# Patient Record
Sex: Female | Born: 1995 | Race: White | Hispanic: No | Marital: Single | Smoking: Never smoker
Health system: Southern US, Community
[De-identification: ages and names within clinical notes are randomized; demographics above are authoritative.]

## PROBLEM LIST (undated history)

## (undated) DIAGNOSIS — E669 Obesity, unspecified: Secondary | ICD-10-CM

---
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
## 2015-07-18 ENCOUNTER — Ambulatory Visit (INDEPENDENT_AMBULATORY_CARE_PROVIDER_SITE_OTHER): Payer: 59 | Admitting: Internal Medicine

## 2015-07-18 ENCOUNTER — Ambulatory Visit (INDEPENDENT_AMBULATORY_CARE_PROVIDER_SITE_OTHER): Payer: 59

## 2015-07-18 VITALS — BP 126/80 | HR 68 | Temp 98.3°F | Resp 16 | Ht 66.0 in | Wt 219.0 lb

## 2015-07-18 DIAGNOSIS — M25571 Pain in right ankle and joints of right foot: Secondary | ICD-10-CM | POA: Diagnosis not present

## 2015-07-18 NOTE — Progress Notes (Signed)
   Subjective:  By signing my name below, I, Amanda [name], attest that this documentation has been prepared under the direction and in the presence of Amanda Lin, MD. Electronically Signed: Moises [name], [place]. 07/18/2015 , 10:58 AM .  Patient was seen in Room 12 .   Patient ID: Amanda [name], female    DOB: 06-05-1995, 20 y.o.   MRN: [id number] Chief Complaint  Patient presents with  . Ankle Injury    yesterday, right, fall   HPI Amanda [name] Risk is a 20 y.o. female who presents to [facility] complaining of right ankle pain due to fall injury that occurred yesterday. She was walking her dog and tripped over a net when walking down a small hill. She slipped her left leg forward and her right leg was pulled back in plantar flexion. She's been limping due to pain.   Patient Active Problem List   Diagnosis Date Noted  . Obesity, Class II, BMI 35-39.9 02/23/2013    Current outpatient prescriptions:  .  lisdexamfetamine (VYVANSE) 20 MG capsule, Take 20 mg by mouth daily., Disp: , Rfl:  No Known Allergies  Review of Systems  Constitutional: Negative for fever, chills and fatigue.  Musculoskeletal: Positive for myalgias, joint swelling, arthralgias and gait problem.  Skin: Negative for rash and wound.  Neurological: Negative for dizziness, weakness, numbness and headaches.       Objective:   Physical Exam  Constitutional: She is oriented to person, place, and time. She appears well-developed and well-nourished. No distress.  HENT:  Head: Normocephalic and atraumatic.  Eyes: EOM are normal. Pupils are equal, round, and reactive to light.  Neck: Neck supple.  Cardiovascular: Normal rate.   Pulmonary/Chest: Effort normal. No respiratory distress.  Musculoskeletal: Normal range of motion.  Right ankle: mild swelling tenderness to palpation over anterior deltoid and the ATF ligament with tenderness at the distal fibula with plantar flexion, achilles intact, ankle mortise intact and  the foot not tender or swollen  Neurological: She is alert and oriented to person, place, and time.  Skin: Skin is warm and dry.  Psychiatric: She has a normal mood and affect. Her behavior is normal.  Nursing note and vitals reviewed.   BP 126/80 mmHg  Pulse 68  Temp(Src) 98.3 F (36.8 C)  Resp 16  Ht 5\' 6"  (1.676 m)  Wt 219 lb (99.338 kg)  BMI 35.36 kg/m2  SpO2 98%  LMP 07/18/2015  Wt Readings from Last 3 Encounters:  07/18/15 219 lb (99.338 kg)  02/23/13 244 lb (110.678 kg) (99 %*, Z = 2.41)  Excellent results--- is continuing to work out frequently * Growth percentiles are based on CDC 2-20 Years data.     Dg Ankle Complete Right  07/18/2015  CLINICAL DATA:  Plantar flexion injury EXAM: RIGHT ANKLE - COMPLETE 3+ VIEW COMPARISON:  None. FINDINGS: Ankle mortise intact. The talar dome is normal. No malleolar fracture. The calcaneus is normal. IMPRESSION: No acute osseous abnormality. Electronically Signed   By: Suzy Bouchard M.D.   On: 07/18/2015 11:09      Assessment & Plan:  Pain in right ankle - Plan: DG Ankle Complete Right Sprain injury plantar flexion  Swede-O brace Advance weightbearing and exercise as tolerated

## 2015-07-18 NOTE — Patient Instructions (Signed)
     IF you received an x-ray today, you will receive an invoice from [facility]. Please contact [facility] at [phone] with questions or concerns regarding your invoice.   IF you received labwork today, you will receive an invoice from [facility]. Please contact [facility] at [phone] with questions or concerns regarding your invoice.   Our billing staff will not be able to assist you with questions regarding bills from these companies.  You will be contacted with the lab results as soon as they are available. The fastest way to get your results is to activate your My Chart account. Instructions are located on the last page of this paperwork. If you have not heard from us regarding the results in 2 weeks, please contact this office.      

## 2017-09-17 ENCOUNTER — Encounter (HOSPITAL_COMMUNITY): Payer: Self-pay | Admitting: Emergency Medicine

## 2017-09-17 ENCOUNTER — Emergency Department (HOSPITAL_COMMUNITY): Payer: No Typology Code available for payment source

## 2017-09-17 ENCOUNTER — Other Ambulatory Visit: Payer: Self-pay

## 2017-09-17 ENCOUNTER — Emergency Department (HOSPITAL_COMMUNITY)
Admission: EM | Admit: 2017-09-17 | Discharge: 2017-09-17 | Disposition: A | Discharge status: Home / Self Care | Payer: No Typology Code available for payment source | Attending: Physician Assistant | Admitting: Physician Assistant

## 2017-09-17 DIAGNOSIS — X501XXA Overexertion from prolonged static or awkward postures, initial encounter: Secondary | ICD-10-CM | POA: Insufficient documentation

## 2017-09-17 DIAGNOSIS — Z79899 Other long term (current) drug therapy: Secondary | ICD-10-CM | POA: Diagnosis not present

## 2017-09-17 DIAGNOSIS — Y939 Activity, unspecified: Secondary | ICD-10-CM | POA: Diagnosis not present

## 2017-09-17 DIAGNOSIS — Y999 Unspecified external cause status: Secondary | ICD-10-CM | POA: Insufficient documentation

## 2017-09-17 DIAGNOSIS — S99191A Other physeal fracture of right metatarsal, initial encounter for closed fracture: Secondary | ICD-10-CM

## 2017-09-17 DIAGNOSIS — S92354A Nondisplaced fracture of fifth metatarsal bone, right foot, initial encounter for closed fracture: Secondary | ICD-10-CM | POA: Diagnosis not present

## 2017-09-17 DIAGNOSIS — Y929 Unspecified place or not applicable: Secondary | ICD-10-CM | POA: Insufficient documentation

## 2017-09-17 MED ORDER — MELOXICAM 15 MG PO TABS: 15.0000 mg | ORAL_TABLET | Freq: Every day | ORAL | 0 refills | Status: DC

## 2017-09-17 NOTE — Progress Notes (Signed)
Orthopedic Tech Progress Note Patient Details:  Amanda [name] 09/26/1995 [id number]  Ortho Devices Type of Ortho Device: Ace wrap, Crutches, Short leg splint, Stirrup splint Ortho Device/Splint Interventions: Application   Post Interventions Patient Tolerated: Well, Ambulated well Instructions Provided: Care of device, Adjustment of device, Poper ambulation with device   [place] Pink 09/17/2017, 11:38 AM

## 2017-09-17 NOTE — Discharge Instructions (Signed)
You have a Jones fracture of the fifth metatarsal.  It is important that you remain nonweightbearing and follow up very closely with the orthopedic physician for further care and evaluation.  Contact a health care provider if: You have a fever. Your cast, splint, or boot is too loose or too tight. Your cast, splint, or boot is damaged. Your pain medicine is not helping. You have pain, tingling, or numbness in your foot that is not going away. Get help right away if: You have severe pain. You have tingling or numbness in your foot that is getting worse. Your foot feels cold or becomes numb. Your foot changes color.

## 2017-09-17 NOTE — ED Notes (Signed)
Ortho paged again

## 2017-09-17 NOTE — ED Notes (Signed)
Patient transported to X-ray 

## 2017-09-17 NOTE — ED Notes (Addendum)
Ortho tech paged- said will be 20 mins

## 2017-09-17 NOTE — ED Provider Notes (Signed)
[facility] EMERGENCY DEPARTMENT Provider Note   CSN: [id number] Arrival date & time: 09/17/17  3785     History   Chief Complaint Chief Complaint  Patient presents with  . Ankle Pain    HPI  Amanda [name] is a 22 y.o. female who complains of inversion injury to the right ankle 3 hours ago. There is pain and swelling at the lateral aspect of that ankle. The patient was not able to bear weight directly after the injury. No hx of previous injury. Denies numbness or tingling.  HPI  History reviewed. No pertinent past medical history.  Patient Active Problem List   Diagnosis Date Noted  . Obesity, Class II, BMI 35-39.9 02/23/2013    History reviewed. No pertinent surgical history.   OB History   None      Home Medications    Prior to Admission medications   Medication Sig Start Date End Date Taking? Authorizing Provider  lisdexamfetamine (VYVANSE) 20 MG capsule Take 20 mg by mouth daily.    [provider]    Family History Family History  Problem Relation Age of Onset  . Diabetes Mother     Social History Social History   Tobacco Use  . Smoking status: Never Smoker  . Smokeless tobacco: Never Used  Substance Use Topics  . Alcohol use: Not Currently  . Drug use: Never     Allergies   Patient has no known allergies.   Review of Systems Review of Systems  Musculoskeletal: Positive for gait problem and joint swelling.  Skin: Negative for wound.  Neurological: Negative for numbness.     Physical Exam Updated Vital Signs BP (!) 143/87 (BP Location: Right Arm)   Pulse 80   Temp 98.7 F (37.1 C) (Oral)   Resp 16   Ht 5\' 6"  (1.676 m)   Wt 96.6 kg (213 lb)   LMP 08/13/2017   SpO2 100%   BMI 34.38 kg/m   Physical Exam  Physical Exam  Nursing note and vitals reviewed. Constitutional: She is oriented to person, place, and time. She appears well-developed and well-nourished. No distress.  HENT:  Head:  Normocephalic and atraumatic.  Eyes: Conjunctivae normal and EOM are normal. Pupils are equal, round, and reactive to light. No scleral icterus.  Neck: Normal range of motion.  Cardiovascular: Normal rate, regular rhythm and normal heart sounds.  Exam reveals no gallop and no friction rub.   No murmur heard. Pulmonary/Chest: Effort normal and breath sounds normal. No respiratory distress.  Abdominal: Soft. Bowel sounds are normal. She exhibits no distension and no mass. There is no tenderness. There is no guarding.  Neurological: She is alert and oriented to person, place, and time.  Musculoskeletal: Patient appears well, vital signs are normal.  CV: RRR, No M/R/G, Peripheral pulses intact. No peripheral edema. Lungs: CTAB Abd: Soft, Non tender, non distended Musculoskeletal: R ankle exam: There is no swelling and tenderness over the lateral malleolus. No tenderness over the medial aspect of the ankle. There is tenderness over the base of the fifth metatarsal. The ankle joint is intact without excessive opening on stressing. X-Ray shows fracture of the base of the 5th metatarsal. The rest of the foot, ankle and leg exam is normal. Skin: Skin is warm and dry. She is not diaphoretic.    ED Treatments / Results  Labs (all labs ordered are listed, but only abnormal results are displayed) Labs Reviewed - No data to display  EKG None  Radiology No results found.  Procedures Procedures (including critical care time) SPLINT APPLICATION Date/Time: 82:64 AM Authorized by: Margarita Mail Consent: Verbal consent obtained. Risks and benefits: risks, benefits and alternatives were discussed Consent given by: patient Splint applied by: orthopedic technician Location details: Right foot Splint type: Posterior short leg Supplies used: Plaster Post-procedure: The splinted body part was neurovascularly unchanged following the procedure. Patient tolerance: Patient tolerated the procedure well  with no immediate complications.    Medications Ordered in ED Medications - No data to display   Initial Impression / Assessment and Plan / ED Course  I have reviewed the triage vital signs and the nursing notes.  Pertinent labs & imaging results that were available during my care of the patient were reviewed by me and considered in my medical decision making (see chart for details).     Patient with Jones fracture of the right foot.  Placed in posterior splint.  Told that she must remain nonweightbearing.  Given crutches.  Neurovascularly intact after splint placement.  She is to follow-up with orthopedics.  Discussed return precautions. Final Clinical Impressions(s) / ED Diagnoses   Final diagnoses:  Closed fracture of base of fifth metatarsal bone of right foot at metaphyseal-diaphyseal junction, initial encounter    ED Discharge Orders    None       Margarita Mail, PA-C 09/17/17 1708    Macarthur Critchley, MD 09/18/17 [phone]

## 2017-09-17 NOTE — ED Triage Notes (Signed)
Pt states she rolled her right ankle this morning and hear a "pop." Slight bruising to lateral side of foot. Pt able to ambulate, but states it is very painful.

## 2017-09-17 NOTE — ED Notes (Signed)
Ortho tech at bedside 

## 2019-02-04 ENCOUNTER — Other Ambulatory Visit: Payer: Self-pay

## 2019-02-04 DIAGNOSIS — Z20822 Contact with and (suspected) exposure to covid-19: Secondary | ICD-10-CM

## 2019-02-06 LAB — NOVEL CORONAVIRUS, NAA: SARS-CoV-2, NAA: NOT DETECTED

## 2021-08-25 ENCOUNTER — Encounter (HOSPITAL_BASED_OUTPATIENT_CLINIC_OR_DEPARTMENT_OTHER): Payer: Self-pay | Admitting: *Deleted

## 2021-08-25 ENCOUNTER — Emergency Department (HOSPITAL_BASED_OUTPATIENT_CLINIC_OR_DEPARTMENT_OTHER): Payer: No Typology Code available for payment source

## 2021-08-25 ENCOUNTER — Other Ambulatory Visit: Payer: Self-pay

## 2021-08-25 ENCOUNTER — Observation Stay (HOSPITAL_BASED_OUTPATIENT_CLINIC_OR_DEPARTMENT_OTHER)
Admission: EM | Admit: 2021-08-25 | Discharge: 2021-08-27 | Disposition: A | Discharge status: Home / Self Care | Payer: No Typology Code available for payment source | Attending: General Surgery | Admitting: General Surgery

## 2021-08-25 DIAGNOSIS — E669 Obesity, unspecified: Secondary | ICD-10-CM

## 2021-08-25 DIAGNOSIS — K8000 Calculus of gallbladder with acute cholecystitis without obstruction: Principal | ICD-10-CM | POA: Insufficient documentation

## 2021-08-25 DIAGNOSIS — R109 Unspecified abdominal pain: Secondary | ICD-10-CM | POA: Insufficient documentation

## 2021-08-25 DIAGNOSIS — K769 Liver disease, unspecified: Secondary | ICD-10-CM

## 2021-08-25 DIAGNOSIS — K838 Other specified diseases of biliary tract: Secondary | ICD-10-CM | POA: Diagnosis not present

## 2021-08-25 DIAGNOSIS — D1809 Hemangioma of other sites: Secondary | ICD-10-CM | POA: Insufficient documentation

## 2021-08-25 DIAGNOSIS — K760 Fatty (change of) liver, not elsewhere classified: Secondary | ICD-10-CM

## 2021-08-25 DIAGNOSIS — R1011 Right upper quadrant pain: Secondary | ICD-10-CM | POA: Diagnosis present

## 2021-08-25 DIAGNOSIS — K8020 Calculus of gallbladder without cholecystitis without obstruction: Secondary | ICD-10-CM

## 2021-08-25 HISTORY — DX: Obesity, unspecified: E66.9

## 2021-08-25 LAB — COMPREHENSIVE METABOLIC PANEL
ALT: 20 U/L (ref 0–44)
AST: 14 U/L — ABNORMAL LOW (ref 15–41)
Albumin: 4 g/dL (ref 3.5–5.0)
Alkaline Phosphatase: 84 U/L (ref 38–126)
Anion gap: 13 (ref 5–15)
BUN: 16 mg/dL (ref 6–20)
CO2: 25 mmol/L (ref 22–32)
Calcium: 9.7 mg/dL (ref 8.9–10.3)
Chloride: 101 mmol/L (ref 98–111)
Creatinine, Ser: 0.58 mg/dL (ref 0.44–1.00)
GFR, Estimated: >60 mL/min (ref >60)
Glucose, Bld: 89 mg/dL (ref 70–99)
Potassium: 4.4 mmol/L (ref 3.5–5.1)
Sodium: 139 mmol/L (ref 135–145)
Total Bilirubin: 0.3 mg/dL (ref 0.3–1.2)
Total Protein: 8.1 g/dL (ref 6.5–8.1)

## 2021-08-25 LAB — CBC
HCT: 35.3 % — ABNORMAL LOW (ref 36.0–46.0)
Hemoglobin: 11 g/dL — ABNORMAL LOW (ref 12.0–15.0)
MCH: 24.2 pg — ABNORMAL LOW (ref 26.0–34.0)
MCHC: 31.2 g/dL (ref 30.0–36.0)
MCV: 77.8 fL — ABNORMAL LOW (ref 80.0–100.0)
Platelets: 381 10*3/uL (ref 150–400)
RBC: 4.54 MIL/uL (ref 3.87–5.11)
RDW: 14.6 % (ref 11.5–15.5)
WBC: 15.7 10*3/uL — ABNORMAL HIGH (ref 4.0–10.5)
nRBC: 0 % (ref 0.0–0.2)

## 2021-08-25 LAB — URINALYSIS, ROUTINE W REFLEX MICROSCOPIC
Bilirubin Urine: NEGATIVE
Glucose, UA: NEGATIVE mg/dL
Ketones, ur: NEGATIVE mg/dL
Nitrite: NEGATIVE
Specific Gravity, Urine: 1.031 — ABNORMAL HIGH (ref 1.005–1.030)
pH: 5.5 (ref 5.0–8.0)

## 2021-08-25 LAB — LIPASE, BLOOD: Lipase: <10 U/L — ABNORMAL LOW (ref 11–51)

## 2021-08-25 LAB — PREGNANCY, URINE: Preg Test, Ur: NEGATIVE

## 2021-08-25 MED ORDER — ONDANSETRON HCL 4 MG/2ML IJ SOLN
4.0000 mg | Freq: Once | INTRAMUSCULAR | Status: AC
  Administered 2021-08-25: 4 mg via INTRAVENOUS
  Filled 2021-08-25: qty 2

## 2021-08-25 MED ORDER — FAMOTIDINE IN NACL 20-0.9 MG/50ML-% IV SOLN
20.0000 mg | Freq: Once | INTRAVENOUS | Status: AC
  Administered 2021-08-25: 20 mg via INTRAVENOUS
  Filled 2021-08-25: qty 50

## 2021-08-25 MED ORDER — PIPERACILLIN-TAZOBACTAM 3.375 G IVPB 30 MIN
3.3750 g | Freq: Once | INTRAVENOUS | Status: AC
Start: 2021-08-25 — End: 2021-08-25
  Administered 2021-08-25: 3.375 g via INTRAVENOUS
  Filled 2021-08-25: qty 50

## 2021-08-25 NOTE — ED Notes (Signed)
Called [facility] to transport patient to [facility] rm#[id number]

## 2021-08-25 NOTE — ED Triage Notes (Signed)
Pt is here for evaluation of abdominal pain which began as a mild discomfort on Sunday and has been getting much worse since yesterday making it difficult for her to sleep.  No n/v with this.  Pt denies any GU symptoms.  Pt reports chronic constipation and last BM was 2 days ago and stool was light and green in color.

## 2021-08-25 NOTE — ED Notes (Signed)
Pt tried providing a urine sample but was not able to go.

## 2021-08-25 NOTE — ED Provider Notes (Signed)
[facility] EMERGENCY DEPT Provider Note   CSN: [id number] Arrival date & time: 08/25/21  1613     History  Chief Complaint  Patient presents with   Abdominal Pain    Amanda [name] is a 26 y.o. female presenting to the ED with a chief complaint of right upper quadrant abdominal pain.  Symptoms began about 2 or 3 days ago.  States that she was concerned that it may be caused by constipation.  She took a laxative about 2 days ago and had a bowel movement but it was "not as much as I usually go."  She will sometimes have issues with constipation based on her diet.  She has tried ibuprofen with only some improvement in her pain.  Denies any nausea, vomiting, lower abdominal pain, urinary symptoms, possibility of pregnancy or history of gallstones that she is aware of.  No prior abdominal surgeries.  No chest pain or shortness of breath   Abdominal Pain Associated symptoms: constipation   Associated symptoms: no chest pain, no chills, no cough, no diarrhea, no dysuria, no fever, no hematuria, no nausea, no shortness of breath, no sore throat and no vomiting       Home Medications Prior to Admission medications   Medication Sig Start Date End Date Taking? Authorizing Provider  lisdexamfetamine (VYVANSE) 20 MG capsule Take 20 mg by mouth daily.    [provider]  meloxicam (MOBIC) 15 MG tablet Take 1 tablet (15 mg total) by mouth daily. Take 1 daily with food. 09/17/17   Margarita Mail, PA-C      Allergies    Patient has no known allergies.    Review of Systems   Review of Systems  Constitutional:  Negative for appetite change, chills and fever.  HENT:  Negative for ear pain, rhinorrhea, sneezing and sore throat.   Eyes:  Negative for photophobia and visual disturbance.  Respiratory:  Negative for cough, chest tightness, shortness of breath and wheezing.   Cardiovascular:  Negative for chest pain and palpitations.  Gastrointestinal:  Positive for abdominal  pain and constipation. Negative for blood in stool, diarrhea, nausea and vomiting.  Genitourinary:  Negative for dysuria, hematuria and urgency.  Musculoskeletal:  Negative for myalgias.  Skin:  Negative for rash.  Neurological:  Negative for dizziness, weakness and light-headedness.   Physical Exam Updated Vital Signs BP (!) 146/77   Pulse 90   Temp 98.4 F (36.9 C)   Resp 18   Wt 112 kg   LMP 08/04/2021   SpO2 97%   BMI 39.87 kg/m  Physical Exam Vitals and nursing note reviewed.  Constitutional:      General: She is not in acute distress.    Appearance: She is well-developed.  HENT:     Head: Normocephalic and atraumatic.     Nose: Nose normal.  Eyes:     General: No scleral icterus.       Left eye: No discharge.     Conjunctiva/sclera: Conjunctivae normal.  Cardiovascular:     Rate and Rhythm: Normal rate and regular rhythm.     Heart sounds: Normal heart sounds. No murmur heard.   No friction rub. No gallop.  Pulmonary:     Effort: Pulmonary effort is normal. No respiratory distress.     Breath sounds: Normal breath sounds.  Abdominal:     General: Bowel sounds are normal. There is no distension.     Palpations: Abdomen is soft.     Tenderness: There is abdominal  tenderness in the right upper quadrant. There is no guarding.  Musculoskeletal:        General: Normal range of motion.     Cervical back: Normal range of motion and neck supple.  Skin:    General: Skin is warm and dry.     Findings: No rash.  Neurological:     Mental Status: She is alert.     Motor: No abnormal muscle tone.     Coordination: Coordination normal.    ED Results / Procedures / Treatments   Labs (all labs ordered are listed, but only abnormal results are displayed) Labs Reviewed  LIPASE, BLOOD - Abnormal; Notable for the following components:      Result Value   Lipase <10 (*)    All other components within normal limits  COMPREHENSIVE METABOLIC PANEL - Abnormal; Notable for the  following components:   AST 14 (*)    All other components within normal limits  CBC - Abnormal; Notable for the following components:   WBC 15.7 (*)    Hemoglobin 11.0 (*)    HCT 35.3 (*)    MCV 77.8 (*)    MCH 24.2 (*)    All other components within normal limits  URINALYSIS, ROUTINE W REFLEX MICROSCOPIC - Abnormal; Notable for the following components:   APPearance HAZY (*)    Specific Gravity, Urine 1.031 (*)    Hgb urine dipstick MODERATE (*)    Protein, ur TRACE (*)    Leukocytes,Ua MODERATE (*)    Bacteria, UA MANY (*)    All other components within normal limits  PREGNANCY, URINE    EKG EKG Interpretation  Date/Time:  Thursday August 25 2021 17:35:40 EDT Ventricular Rate:  79 PR Interval:  144 QRS Duration: 80 QT Interval:  357 QTC Calculation: 410 R Axis:   37 Text Interpretation: Sinus rhythm No previous ECGs available Confirmed by Gareth Morgan [phone]) on 08/25/2021 7:02:56 PM  Radiology US Abdomen Limited RUQ (LIVER/GB)  Result Date: 08/25/2021 CLINICAL DATA:  Provided history: Abdominal pain for 5 days, worsening today. EXAM: ULTRASOUND ABDOMEN LIMITED RIGHT UPPER QUADRANT COMPARISON:  None Available. FINDINGS: Gallbladder: Cholecystolithiasis. Additional non-mobile shadowing foci within the gallbladder, which likely reflect adherent gallstones in a patient of this age. Mildly thickened appearance of the gallbladder wall (measuring 4-5 mm). However, the gallbladder is underdistended. No sonographic Murphy sign reported by the scanning technologist. Common bile duct: Diameter: 6-7 mm, mildly dilated. Liver: 3.5 x 2.9 x 4.9 cm hypoechoic mass with associated color Doppler flow in the left hepatic lobe. Background significantly increased hepatic parenchymal echogenicity.Portal vein is patent on color Doppler imaging with normal direction of blood flow towards the liver. IMPRESSION: Cholecystolithiasis. Additionally, there is a mildly thickened appearance of the gallbladder  wall (measuring 4-5 mm). However, the gallbladder is somewhat underdistended potentially accounting for this finding. Mildly dilated common bile duct (6-7 mm in diameter). 3.5 x 3.9 x 4.9 cm hypoechoic mass with associated color Doppler flow in the left hepatic lobe. Background prominent hepatic steatosis. Given the left hepatic lobe mass, apparent severe hepatic steatosis and mild common bile duct dilation, an MRI without and with contrast and MRCP are recommended for further evaluation. This may be non-emergent, if clinically appropriate. Electronically Signed   By: Kellie Simmering D.O.   On: 08/25/2021 18:45    Procedures Procedures    Medications Ordered in ED Medications  famotidine (PEPCID) IVPB 20 mg premix (0 mg Intravenous Stopped 08/25/21 1816)  ondansetron (ZOFRAN) injection 4  mg (4 mg Intravenous Given 08/25/21 1746)  piperacillin-tazobactam (ZOSYN) IVPB 3.375 g (0 g Intravenous Stopped 08/25/21 1948)    ED Course/ Medical Decision Making/ A&P                           Medical Decision Making Amount and/or Complexity of Data Reviewed Labs: ordered. Radiology: ordered.  Risk Prescription drug management. Decision regarding hospitalization.   27 year old female presenting to the ED with a chief complaint of right upper quadrant pain.  Symptoms have been going on for the past 2 to 3 days.  Reports associated constipation which is not unusual for her given changes to her diet.  Last bowel movement was 2 days ago.  Minimal improvement noted with ibuprofen taken yesterday.  Denies any vomiting, nausea, fever, chest pain.  On exam she has tenderness palpation of the right upper quadrant without rebound or guarding.  No lower tenderness.  Lungs are clear to auscultation bilaterally.  Vital signs within normal limits.  Will obtain lab work, right upper quadrant ultrasound to evaluate for cause of symptoms and attempted treat symptomatically.  Lab work significant for leukocytosis of 15.   Urinalysis with moderate leukocytes, many bacteria but patient asymptomatic.  CMP and lipase unremarkable.  EKG shows sinus rhythm, no ischemic changes, no STEMI.  Right upper quadrant ultrasound shows findings consistent with cholecystolithiasis.  There is thickened appearance of the gallbladder as well as dilated common bile duct.  There is also a hypoechoic mass on the left hepatic lobe.  I spoke to Dr. Redmond Pulling, general surgeon on-call.  He recommends MRI and they will evaluate the patient in the consult as well as admitting to hospitalist service at [facility].  I have started patient on antibiotics and her symptoms have been controlled here with medications.  Dr. Myna Hidalgo to admit to [facility].    Portions of this note were generated with [employer]. Dictation errors may occur despite best attempts at proofreading.         Final Clinical Impression(s) / ED Diagnoses Final diagnoses:  Liver lesion  Gallbladder colic    Rx / DC Orders ED Discharge Orders     None         Delia Heady, PA-C 08/25/21 Thresa Ross, MD 08/26/21 [phone]

## 2021-08-25 NOTE — H&P (Signed)
History and Physical    Patient: Amanda [name] FYB:[id number] DOB: 02/02/1996 DOA: 08/25/2021 DOS: the patient was seen and examined on 08/26/2021 PCP: Patient, No Pcp Per (Inactive)  Patient coming from: [place] ED  Chief Complaint:  Chief Complaint  Patient presents with   Abdominal Pain   HPI: Amanda [name] is a 26 y.o. female with medical history significant of obesity, prediabetes who presents with concerns of right upper quadrant pain.  Pain started about 2 days ago and was intermittent and crampy.  Worse with certain movements and with greasy food.  Initially thought she was constipated started to a high-fiber diet with improvement in her symptoms.  No nausea, vomiting or diarrhea.  No fevers or chills.  No dysuria, increasing urgency or frequency.  No previous abdominal surgery.  Only has occasional alcohol use.  In the ED, she was afebrile normotensive room air.  Had leukocytosis of 15.7.  CMP otherwise unremarkable with normal LFTs.  Lipase within normal limits.  Right upper quadrant ultrasound showed cholecystolithiasis and mildly thickened appearance of the gallbladder wall.  Mildly dilated common bile duct of 6 to 7 mm.  There is also a hypoechoic mass in the left hepatic lobe.  Background prominent hepatic steatosis. MRCP of the abdomen was recommended.  ED PA discussed with general surgeon Dr. Redmond Pulling on-call and they will follow in consultation in the morning.  She was started on IV Zosyn and transferred here to count.   Review of Systems: As mentioned in the history of present illness. All other systems reviewed and are negative. Past Medical History:  Diagnosis Date   Obesity    History reviewed. No pertinent surgical history. Social History:  reports that she has never smoked. She has never used smokeless tobacco. She reports that she does not currently use alcohol. She reports that she does not use drugs.  No Known Allergies  Family History   Problem Relation Age of Onset   Diabetes Mother     Prior to Admission medications   Medication Sig Start Date End Date Taking? Authorizing Provider  lisdexamfetamine (VYVANSE) 20 MG capsule Take 20 mg by mouth daily.    [provider]  meloxicam (MOBIC) 15 MG tablet Take 1 tablet (15 mg total) by mouth daily. Take 1 daily with food. 09/17/17   Margarita Mail, PA-C    Physical Exam: Vitals:   08/25/21 2030 08/25/21 2130 08/25/21 2235 08/25/21 2330  BP: 122/64 140/66 137/77 132/65  Pulse: 88 84 79 84  Resp: '16 13 15 17  '$ Temp:   98.1 F (36.7 C) 98.1 F (36.7 C)  TempSrc:   Oral Oral  SpO2: 98% 98% 95% 96%  Weight:       Constitutional: NAD, calm, comfortable, young obese female laying in bed Eyes: PERRL ENMT: Mucous membranes are moist.  Neck: normal, supple Respiratory: clear to auscultation bilaterally, no wheezing, no crackles. Normal respiratory effort.   Cardiovascular: Regular rate and rhythm, no murmurs / rubs / gallops.  Trace nonpitting edema bilateral lower extremity. Abdomen: Soft, nondistended, mild tenderness to lower border of right upper quadrant, no masses palpated. No hepatosplenomegaly. Bowel sounds positive.  Musculoskeletal: no clubbing / cyanosis. No joint deformity upper and lower extremities. Good ROM, no contractures. Normal muscle tone.  Skin: no rashes, lesions, ulcers.  Neurologic: CN 2-12 grossly intact.  Strength 5/5 in all 4.  Psychiatric: Normal judgment and insight. Alert and oriented x 3. Normal mood. Data Reviewed:  See HPI  Assessment  and Plan: * Cholecystolithiasis Mildly dilated common bile duct Seen on RUQ ultrasound with mildly thickened appearance of the gallbladder wall.  Questionable cholecystitis with leukocytosis.  Will continue on IV Zosyn and general surgery will see in consultation tomorrow. -MRCP of the abdomen pending  Hepatic steatosis Encourage weight loss.  Patient reports that she has been working towards  weight loss and has lost 3 pounds recently.  Hepatic lesion Mass seen in the left hepatic lobe on RUQ ultrasound.  MRCP is pending.  Obesity, Class II, BMI 35-39.9 Patient currently working towards weight loss and has lost 3 pounds recently.      Advance Care Planning:   Code Status: Full Code   Consults: General surgery  Family Communication: Discussed with mother at bedside  Severity of Illness: The appropriate patient status for this patient is OBSERVATION. Observation status is judged to be reasonable and necessary in order to provide the required intensity of service to ensure the patient's safety. The patient's presenting symptoms, physical exam findings, and initial radiographic and laboratory data in the context of their medical condition is felt to place them at decreased risk for further clinical deterioration. Furthermore, it is anticipated that the patient will be medically stable for discharge from the hospital within 2 midnights of admission.   Author: Orene Desanctis, DO 08/26/2021 12:45 AM  For on call review [email or web address].

## 2021-08-26 ENCOUNTER — Encounter (HOSPITAL_COMMUNITY): Payer: Self-pay | Admitting: Family Medicine

## 2021-08-26 ENCOUNTER — Observation Stay (HOSPITAL_COMMUNITY): Payer: No Typology Code available for payment source

## 2021-08-26 ENCOUNTER — Observation Stay (HOSPITAL_COMMUNITY): Payer: No Typology Code available for payment source | Admitting: Certified Registered Nurse Anesthetist

## 2021-08-26 ENCOUNTER — Other Ambulatory Visit: Payer: Self-pay

## 2021-08-26 ENCOUNTER — Encounter (HOSPITAL_COMMUNITY)
Admission: EM | Disposition: A | Discharge status: Home / Self Care | Payer: Self-pay | Source: Home / Self Care | Attending: Emergency Medicine

## 2021-08-26 ENCOUNTER — Observation Stay (HOSPITAL_BASED_OUTPATIENT_CLINIC_OR_DEPARTMENT_OTHER): Payer: No Typology Code available for payment source | Admitting: Certified Registered Nurse Anesthetist

## 2021-08-26 DIAGNOSIS — K8020 Calculus of gallbladder without cholecystitis without obstruction: Secondary | ICD-10-CM | POA: Diagnosis not present

## 2021-08-26 DIAGNOSIS — K769 Liver disease, unspecified: Secondary | ICD-10-CM

## 2021-08-26 DIAGNOSIS — K8000 Calculus of gallbladder with acute cholecystitis without obstruction: Secondary | ICD-10-CM | POA: Diagnosis not present

## 2021-08-26 DIAGNOSIS — K760 Fatty (change of) liver, not elsewhere classified: Secondary | ICD-10-CM

## 2021-08-26 HISTORY — PX: CHOLECYSTECTOMY: SHX55

## 2021-08-26 LAB — CBC
HCT: 34.1 % — ABNORMAL LOW (ref 36.0–46.0)
Hemoglobin: 10.7 g/dL — ABNORMAL LOW (ref 12.0–15.0)
MCH: 24.8 pg — ABNORMAL LOW (ref 26.0–34.0)
MCHC: 31.4 g/dL (ref 30.0–36.0)
MCV: 78.9 fL — ABNORMAL LOW (ref 80.0–100.0)
Platelets: 362 10*3/uL (ref 150–400)
RBC: 4.32 MIL/uL (ref 3.87–5.11)
RDW: 14.6 % (ref 11.5–15.5)
WBC: 13.2 10*3/uL — ABNORMAL HIGH (ref 4.0–10.5)
nRBC: 0 % (ref 0.0–0.2)

## 2021-08-26 LAB — COMPREHENSIVE METABOLIC PANEL
ALT: 27 U/L (ref 0–44)
AST: 22 U/L (ref 15–41)
Albumin: 3 g/dL — ABNORMAL LOW (ref 3.5–5.0)
Alkaline Phosphatase: 88 U/L (ref 38–126)
Anion gap: 9 (ref 5–15)
BUN: 14 mg/dL (ref 6–20)
CO2: 24 mmol/L (ref 22–32)
Calcium: 8.9 mg/dL (ref 8.9–10.3)
Chloride: 106 mmol/L (ref 98–111)
Creatinine, Ser: 0.7 mg/dL (ref 0.44–1.00)
GFR, Estimated: >60 mL/min (ref >60)
Glucose, Bld: 97 mg/dL (ref 70–99)
Potassium: 4.2 mmol/L (ref 3.5–5.1)
Sodium: 139 mmol/L (ref 135–145)
Total Bilirubin: 0.3 mg/dL (ref 0.3–1.2)
Total Protein: 7 g/dL (ref 6.5–8.1)

## 2021-08-26 LAB — HIV ANTIBODY (ROUTINE TESTING W REFLEX): HIV Screen 4th Generation wRfx: NONREACTIVE

## 2021-08-26 SURGERY — LAPAROSCOPIC CHOLECYSTECTOMY
Anesthesia: General | Site: Abdomen

## 2021-08-26 MED ORDER — LIDOCAINE 2% (20 MG/ML) 5 ML SYRINGE
INTRAMUSCULAR | Status: DC | PRN
  Administered 2021-08-26: 50 mg via INTRAVENOUS

## 2021-08-26 MED ORDER — ONDANSETRON HCL 4 MG/2ML IJ SOLN: 4.0000 mg | Freq: Once | INTRAMUSCULAR | Status: DC | PRN

## 2021-08-26 MED ORDER — HEMOSTATIC AGENTS (NO CHARGE) OPTIME
TOPICAL | Status: DC | PRN
  Administered 2021-08-26: 1 via TOPICAL

## 2021-08-26 MED ORDER — PIPERACILLIN-TAZOBACTAM 3.375 G IVPB
3.3750 g | Freq: Three times a day (TID) | INTRAVENOUS | Status: DC
  Administered 2021-08-26 – 2021-08-27 (×4): 3.375 g via INTRAVENOUS
  Filled 2021-08-26 (×6): qty 50

## 2021-08-26 MED ORDER — GADOBUTROL 1 MMOL/ML IV SOLN
9.0000 mL | Freq: Once | INTRAVENOUS | Status: AC | PRN
Start: 2021-08-26 — End: 2021-08-26
  Administered 2021-08-26: 9 mL via INTRAVENOUS

## 2021-08-26 MED ORDER — ONDANSETRON HCL 4 MG/2ML IJ SOLN
INTRAMUSCULAR | Status: DC | PRN
  Administered 2021-08-26: 4 mg via INTRAVENOUS

## 2021-08-26 MED ORDER — CHLORHEXIDINE GLUCONATE 0.12 % MT SOLN: 15.0000 mL | Freq: Once | OROMUCOSAL | Status: AC

## 2021-08-26 MED ORDER — FENTANYL CITRATE (PF) 100 MCG/2ML IJ SOLN
INTRAMUSCULAR | Status: AC
  Filled 2021-08-26: qty 2

## 2021-08-26 MED ORDER — DEXAMETHASONE SODIUM PHOSPHATE 10 MG/ML IJ SOLN
INTRAMUSCULAR | Status: DC | PRN
  Administered 2021-08-26: 10 mg via INTRAVENOUS

## 2021-08-26 MED ORDER — MIDAZOLAM HCL 2 MG/2ML IJ SOLN
INTRAMUSCULAR | Status: AC
  Filled 2021-08-26: qty 2

## 2021-08-26 MED ORDER — FENTANYL CITRATE (PF) 250 MCG/5ML IJ SOLN
INTRAMUSCULAR | Status: AC
  Filled 2021-08-26: qty 5

## 2021-08-26 MED ORDER — MORPHINE SULFATE (PF) 2 MG/ML IV SOLN: 2.0000 mg | INTRAVENOUS | Status: DC | PRN

## 2021-08-26 MED ORDER — MIDAZOLAM HCL 2 MG/2ML IJ SOLN
INTRAMUSCULAR | Status: DC | PRN
  Administered 2021-08-26: 2 mg via INTRAVENOUS

## 2021-08-26 MED ORDER — DEXAMETHASONE SODIUM PHOSPHATE 10 MG/ML IJ SOLN
INTRAMUSCULAR | Status: AC
  Filled 2021-08-26: qty 1

## 2021-08-26 MED ORDER — PROPOFOL 10 MG/ML IV BOLUS
INTRAVENOUS | Status: AC
  Filled 2021-08-26: qty 20

## 2021-08-26 MED ORDER — ACETAMINOPHEN 500 MG PO TABS
ORAL_TABLET | ORAL | Status: AC
  Filled 2021-08-26: qty 2

## 2021-08-26 MED ORDER — FENTANYL CITRATE (PF) 100 MCG/2ML IJ SOLN
25.0000 ug | INTRAMUSCULAR | Status: DC | PRN
  Administered 2021-08-26 (×2): 25 ug via INTRAVENOUS

## 2021-08-26 MED ORDER — FENTANYL CITRATE (PF) 250 MCG/5ML IJ SOLN
INTRAMUSCULAR | Status: DC | PRN
  Administered 2021-08-26 (×2): 100 ug via INTRAVENOUS
  Administered 2021-08-26: 50 ug via INTRAVENOUS

## 2021-08-26 MED ORDER — IBUPROFEN 600 MG PO TABS
600.0000 mg | ORAL_TABLET | Freq: Four times a day (QID) | ORAL | Status: DC | PRN
  Administered 2021-08-27: 600 mg via ORAL
  Filled 2021-08-26: qty 1

## 2021-08-26 MED ORDER — ONDANSETRON HCL 4 MG/2ML IJ SOLN
INTRAMUSCULAR | Status: AC
  Filled 2021-08-26: qty 2

## 2021-08-26 MED ORDER — LIDOCAINE 2% (20 MG/ML) 5 ML SYRINGE
INTRAMUSCULAR | Status: AC
  Filled 2021-08-26: qty 10

## 2021-08-26 MED ORDER — CHLORHEXIDINE GLUCONATE 0.12 % MT SOLN
OROMUCOSAL | Status: AC
  Administered 2021-08-26: 15 mL via OROMUCOSAL
  Filled 2021-08-26: qty 15

## 2021-08-26 MED ORDER — AMISULPRIDE (ANTIEMETIC) 5 MG/2ML IV SOLN: 10.0000 mg | Freq: Once | INTRAVENOUS | Status: DC | PRN

## 2021-08-26 MED ORDER — GLYCOPYRROLATE PF 0.2 MG/ML IJ SOSY
PREFILLED_SYRINGE | INTRAMUSCULAR | Status: AC
  Filled 2021-08-26: qty 1

## 2021-08-26 MED ORDER — ORAL CARE MOUTH RINSE: 15.0000 mL | Freq: Once | OROMUCOSAL | Status: AC

## 2021-08-26 MED ORDER — SUGAMMADEX SODIUM 200 MG/2ML IV SOLN
INTRAVENOUS | Status: DC | PRN
  Administered 2021-08-26: 200 mg via INTRAVENOUS

## 2021-08-26 MED ORDER — ROCURONIUM BROMIDE 10 MG/ML (PF) SYRINGE
PREFILLED_SYRINGE | INTRAVENOUS | Status: DC | PRN
  Administered 2021-08-26: 80 mg via INTRAVENOUS

## 2021-08-26 MED ORDER — OXYCODONE HCL 5 MG/5ML PO SOLN: 5.0000 mg | Freq: Once | ORAL | Status: DC | PRN

## 2021-08-26 MED ORDER — SODIUM CHLORIDE 0.9 % IR SOLN
Status: DC | PRN
  Administered 2021-08-26: 1000 mL

## 2021-08-26 MED ORDER — 0.9 % SODIUM CHLORIDE (POUR BTL) OPTIME
TOPICAL | Status: DC | PRN
  Administered 2021-08-26: 1000 mL

## 2021-08-26 MED ORDER — HYDROMORPHONE HCL 1 MG/ML IJ SOLN
INTRAMUSCULAR | Status: AC
  Filled 2021-08-26: qty 0.5

## 2021-08-26 MED ORDER — LACTATED RINGERS IV SOLN: INTRAVENOUS | Status: DC

## 2021-08-26 MED ORDER — OXYCODONE HCL 5 MG PO TABS: 5.0000 mg | ORAL_TABLET | Freq: Once | ORAL | Status: DC | PRN

## 2021-08-26 MED ORDER — PROPOFOL 10 MG/ML IV BOLUS
INTRAVENOUS | Status: DC | PRN
  Administered 2021-08-26: 200 mg via INTRAVENOUS

## 2021-08-26 MED ORDER — BUPIVACAINE HCL (PF) 0.25 % IJ SOLN
INTRAMUSCULAR | Status: DC | PRN
  Administered 2021-08-26: 30 mL

## 2021-08-26 MED ORDER — ROCURONIUM BROMIDE 10 MG/ML (PF) SYRINGE
PREFILLED_SYRINGE | INTRAVENOUS | Status: AC
  Filled 2021-08-26: qty 20

## 2021-08-26 MED ORDER — OXYCODONE HCL 5 MG PO TABS
5.0000 mg | ORAL_TABLET | Freq: Four times a day (QID) | ORAL | Status: DC | PRN
  Administered 2021-08-26 – 2021-08-27 (×2): 5 mg via ORAL
  Filled 2021-08-26 (×2): qty 1

## 2021-08-26 MED ORDER — ACETAMINOPHEN 500 MG PO TABS
1000.0000 mg | ORAL_TABLET | Freq: Once | ORAL | Status: AC
  Administered 2021-08-26: 1000 mg via ORAL

## 2021-08-26 MED ORDER — HYDROMORPHONE HCL 1 MG/ML IJ SOLN
INTRAMUSCULAR | Status: DC | PRN
  Administered 2021-08-26: .5 mg via INTRAVENOUS

## 2021-08-26 MED ORDER — SCOPOLAMINE 1 MG/3DAYS TD PT72
MEDICATED_PATCH | TRANSDERMAL | Status: AC
  Filled 2021-08-26: qty 1

## 2021-08-26 MED ORDER — SCOPOLAMINE 1 MG/3DAYS TD PT72
1.0000 | MEDICATED_PATCH | TRANSDERMAL | Status: DC
  Administered 2021-08-26: 1.5 mg via TRANSDERMAL

## 2021-08-26 MED ORDER — BUPIVACAINE HCL (PF) 0.25 % IJ SOLN
INTRAMUSCULAR | Status: AC
  Filled 2021-08-26: qty 30

## 2021-08-26 SURGICAL SUPPLY — 46 items
ADH SKN CLS APL DERMABOND .7 (GAUZE/BANDAGES/DRESSINGS) ×1
APL PRP STRL LF DISP 70% ISPRP (MISCELLANEOUS) ×1
APPLIER CLIP ROT 10 11.4 M/L (STAPLE) ×2
APR CLP MED LRG 11.4X10 (STAPLE) ×1
BAG COUNTER SPONGE SURGICOUNT (BAG) ×2 IMPLANT
BAG SPEC RTRVL 10 TROC 200 (ENDOMECHANICALS) ×1
BAG SPNG CNTER NS LX DISP (BAG) ×1
BLADE CLIPPER SURG (BLADE) IMPLANT
CANISTER SUCT 3000ML PPV (MISCELLANEOUS) ×2 IMPLANT
CHLORAPREP W/TINT 26 (MISCELLANEOUS) ×2 IMPLANT
CLIP APPLIE ROT 10 11.4 M/L (STAPLE) IMPLANT
CLIP LIGATING HEMO LOK XL GOLD (MISCELLANEOUS) IMPLANT
CLIP LIGATING HEMO O LOK GREEN (MISCELLANEOUS) ×2 IMPLANT
COVER SURGICAL LIGHT HANDLE (MISCELLANEOUS) ×2 IMPLANT
DERMABOND ADVANCED (GAUZE/BANDAGES/DRESSINGS) ×1
DERMABOND ADVANCED .7 DNX12 (GAUZE/BANDAGES/DRESSINGS) ×1 IMPLANT
ELECT REM PT RETURN 9FT ADLT (ELECTROSURGICAL) ×2
ELECTRODE REM PT RTRN 9FT ADLT (ELECTROSURGICAL) ×1 IMPLANT
ENDOLOOP SUT PDS II  0 18 (SUTURE) ×4
ENDOLOOP SUT PDS II 0 18 (SUTURE) IMPLANT
GLOVE BIOGEL PI IND STRL 7.0 (GLOVE) ×1 IMPLANT
GLOVE BIOGEL PI INDICATOR 7.0 (GLOVE) ×1
GLOVE SURG SS PI 7.0 STRL IVOR (GLOVE) ×2 IMPLANT
GOWN STRL REUS W/ TWL LRG LVL3 (GOWN DISPOSABLE) ×3 IMPLANT
GOWN STRL REUS W/TWL LRG LVL3 (GOWN DISPOSABLE) ×6
GRASPER SUT TROCAR 14GX15 (MISCELLANEOUS) ×2 IMPLANT
HEMOSTAT SNOW SURGICEL 2X4 (HEMOSTASIS) ×1 IMPLANT
KIT BASIN OR (CUSTOM PROCEDURE TRAY) ×2 IMPLANT
KIT TURNOVER KIT B (KITS) ×2 IMPLANT
NEEDLE 22X1 1/2 (OR ONLY) (NEEDLE) ×2 IMPLANT
NS IRRIG 1000ML POUR BTL (IV SOLUTION) ×2 IMPLANT
PAD ARMBOARD 7.5X6 YLW CONV (MISCELLANEOUS) ×2 IMPLANT
POUCH RETRIEVAL ECOSAC 10 (ENDOMECHANICALS) ×1 IMPLANT
POUCH RETRIEVAL ECOSAC 10MM (ENDOMECHANICALS) ×2
SCISSORS LAP 5X35 DISP (ENDOMECHANICALS) ×2 IMPLANT
SET IRRIG TUBING LAPAROSCOPIC (IRRIGATION / IRRIGATOR) ×2 IMPLANT
SET TUBE SMOKE EVAC HIGH FLOW (TUBING) ×2 IMPLANT
SLEEVE ENDOPATH XCEL 5M (ENDOMECHANICALS) ×4 IMPLANT
SPECIMEN JAR SMALL (MISCELLANEOUS) ×2 IMPLANT
SUT MNCRL AB 4-0 PS2 18 (SUTURE) ×2 IMPLANT
TOWEL GREEN STERILE (TOWEL DISPOSABLE) ×2 IMPLANT
TOWEL GREEN STERILE FF (TOWEL DISPOSABLE) ×2 IMPLANT
TRAY LAPAROSCOPIC MC (CUSTOM PROCEDURE TRAY) ×2 IMPLANT
TROCAR XCEL 12X100 BLDLESS (ENDOMECHANICALS) ×2 IMPLANT
TROCAR XCEL NON-BLD 5MMX100MML (ENDOMECHANICALS) ×2 IMPLANT
WATER STERILE IRR 1000ML POUR (IV SOLUTION) ×2 IMPLANT

## 2021-08-26 NOTE — Assessment & Plan Note (Addendum)
Mildly dilated common bile duct Seen on RUQ ultrasound with mildly thickened appearance of the gallbladder wall.  Questionable cholecystitis with leukocytosis.  Will continue on IV Zosyn and general surgery will see in consultation tomorrow. -MRCP of the abdomen pending

## 2021-08-26 NOTE — Transfer of Care (Signed)
Immediate Anesthesia Transfer of Care Note  Patient: Amanda [name]  Procedure(s) Performed: LAPAROSCOPIC CHOLECYSTECTOMY (Abdomen)  Patient Location: PACU  Anesthesia Type:General  Level of Consciousness: drowsy  Airway & Oxygen Therapy: Patient Spontanous Breathing  Post-op Assessment: Report given to RN and Post -op Vital signs reviewed and stable  Post vital signs: Reviewed and stable  Last Vitals:  Vitals Value Taken Time  BP 140/83 08/26/21 1258  Temp    Pulse 88 08/26/21 1304  Resp 20 08/26/21 1304  SpO2 96 % 08/26/21 1304  Vitals shown include unvalidated device data.  Last Pain:  Vitals:   08/26/21 1109  TempSrc:   PainSc: 2          Complications: No notable events documented.

## 2021-08-26 NOTE — Op Note (Addendum)
PATIENT:  Amanda [name]  26 y.o. female  PRE-OPERATIVE DIAGNOSIS:  Gallstones  POST-OPERATIVE DIAGNOSIS:  Gallstones  PROCEDURE:  Procedure(s): LAPAROSCOPIC CHOLECYSTECTOMY   SURGEON:  Evia Goldsmith, Arta Bruce, MD   ASSISTANT: none  ANESTHESIA:   local and general  Indications for procedure: Amanda [name] is a 26 y.o. female with symptoms of Abdominal pain and Nausea and vomiting consistent with gallbladder disease, Confirmed by ultrasound and MRI.  Description of procedure: The patient was brought into the operative suite, placed supine. Anesthesia was administered with endotracheal tube. Patient was strapped in place and foot board was secured. All pressure points were offloaded by foam padding. The patient was prepped and draped in the usual sterile fashion.  A periumbilical incision was made and optical entry was used to enter the abdomen. 2 5 mm trocars were placed on in the right lateral space on in the right subcostal space. A 41m trocar was placed in the subxiphoid space. Marcaine was infused to the subxiphoid space and lateral upper right abdomen in the transversus abdominis plane. Next the patient was placed in reverse trendelenberg. The gallbladder appeareddilated, acutely inflamed, and gangrenous. Due to the level of dilation, the gallbladder was evacuated with suction.  The gallbladder was retracted cephalad and lateral. The peritoneum was reflected off the infundibulum working lateral to medial. The cystic duct and cystic artery were identified and further dissection revealed a critical view. An endoloop was placed across the cystic duct. The cystic artery was doubly clipped and ligated.   The gallbladder was removed off the liver bed with cautery. The Gallbladder was placed in a specimen bag. The gallbladder fossa was irrigated and hemostasis was applied with cautery. One titanium clip was placed on the rind of the lateral gallbladder fossa. Amanda Morganwas placed in the gallbladder  fossa. The gallbladder was removed via the 183mtrocar. The fascial defect was closed with interrupted 0 vicryl suture via laparoscopic trans-fascial suture passer and loose stones were removed. Pneumoperitoneum was removed, all trocar were removed. All incisions were closed with 4-0 monocryl subcuticular stitch. The patient woke from anesthesia and was brought to PACU in stable condition. All counts were correct  Findings: gangrenous cholecystitis  Specimen: gallbladder  Blood loss: 100 ml  Local anesthesia: 30 ml Marcaine  Complications: none  PLAN OF CARE: Admit for overnight observation  PATIENT DISPOSITION:  PACU - hemodynamically stable.  [place], PAUtah

## 2021-08-26 NOTE — Consult Note (Signed)
Reason for Consult:gallstones Referring Provider: Hanny Elsberry is an 26 y.o. female.  HPI: 26 yo female with 1 day of abdominal pain. She initially thought it was constipation, but it persisted. It is epigastric. It does not radiate. She has nausea but no vomiting or diarrhea. She denies fevers or chills.  Past Medical History:  Diagnosis Date   Obesity     History reviewed. No pertinent surgical history.  Family History  Problem Relation Age of Onset   Diabetes Mother     Social History:  reports that she has never smoked. She has never used smokeless tobacco. She reports that she does not currently use alcohol. She reports that she does not use drugs.  Allergies: No Known Allergies  Medications: I have reviewed the patient's current medications.  Results for orders placed or performed during the hospital encounter of 08/25/21 (from the past 48 hour(s))  Lipase, blood     Status: Abnormal   Collection Time: 08/25/21  5:38 PM  Result Value Ref Range   Lipase <10 (L) 11 - 51 U/L    Comment: Performed at [facility], [address], [place], [place] [address]  Comprehensive metabolic panel     Status: Abnormal   Collection Time: 08/25/21  5:38 PM  Result Value Ref Range   Sodium 139 135 - 145 mmol/L   Potassium 4.4 3.5 - 5.1 mmol/L   Chloride 101 98 - 111 mmol/L   CO2 25 22 - 32 mmol/L   Glucose, Bld 89 70 - 99 mg/dL    Comment: Glucose reference range applies only to samples taken after fasting for at least 8 hours.   BUN 16 6 - 20 mg/dL   Creatinine, Ser 0.58 0.44 - 1.00 mg/dL   Calcium 9.7 8.9 - 10.3 mg/dL   Total Protein 8.1 6.5 - 8.1 g/dL   Albumin 4.0 3.5 - 5.0 g/dL   AST 14 (L) 15 - 41 U/L   ALT 20 0 - 44 U/L   Alkaline Phosphatase 84 38 - 126 U/L   Total Bilirubin 0.3 0.3 - 1.2 mg/dL   GFR, Estimated >60 >60 mL/min    Comment: (NOTE) Calculated using the CKD-EPI Creatinine Equation (2021)    Anion gap 13 5 - 15     Comment: Performed at [facility], [address] [place], [place] [address]  CBC     Status: Abnormal   Collection Time: 08/25/21  5:38 PM  Result Value Ref Range   WBC 15.7 (H) 4.0 - 10.5 K/uL   RBC 4.54 3.87 - 5.11 MIL/uL   Hemoglobin 11.0 (L) 12.0 - 15.0 g/dL   HCT 35.3 (L) 36.0 - 46.0 %   MCV 77.8 (L) 80.0 - 100.0 fL   MCH 24.2 (L) 26.0 - 34.0 pg   MCHC 31.2 30.0 - 36.0 g/dL   RDW 14.6 11.5 - 15.5 %   Platelets 381 150 - 400 K/uL   nRBC 0.0 0.0 - 0.2 %    Comment: Performed at [facility], [place], [place] [address]  Urinalysis, Routine w reflex microscopic Urine, Clean Catch     Status: Abnormal   Collection Time: 08/25/21  5:44 PM  Result Value Ref Range   Color, Urine YELLOW YELLOW   APPearance HAZY (A) CLEAR   Specific Gravity, Urine 1.031 (H) 1.005 - 1.030   pH 5.5 5.0 - 8.0   Glucose, UA NEGATIVE NEGATIVE mg/dL   Hgb urine  dipstick MODERATE (A) NEGATIVE   Bilirubin Urine NEGATIVE NEGATIVE   Ketones, ur NEGATIVE NEGATIVE mg/dL   Protein, ur TRACE (A) NEGATIVE mg/dL   Nitrite NEGATIVE NEGATIVE   Leukocytes,Ua MODERATE (A) NEGATIVE   RBC / HPF 0-5 0 - 5 RBC/hpf   WBC, UA 6-10 0 - 5 WBC/hpf   Bacteria, UA MANY (A) NONE SEEN   Squamous Epithelial / LPF 0-5 0 - 5   Mucus PRESENT     Comment: Performed at [facility], [address] [place], [place] [address]  Pregnancy, urine     Status: None   Collection Time: 08/25/21  5:44 PM  Result Value Ref Range   Preg Test, Ur NEGATIVE NEGATIVE    Comment:        THE SENSITIVITY OF THIS METHODOLOGY IS >20 mIU/mL. Performed at [facility], [address] [place], [place] [address]   Comprehensive metabolic panel     Status: Abnormal   Collection Time: 08/26/21  2:55 AM  Result Value Ref Range   Sodium 139 135 - 145 mmol/L   Potassium 4.2 3.5 - 5.1 mmol/L   Chloride 106 98 - 111 mmol/L   CO2 24 22 - 32 mmol/L   Glucose,  Bld 97 70 - 99 mg/dL    Comment: Glucose reference range applies only to samples taken after fasting for at least 8 hours.   BUN 14 6 - 20 mg/dL   Creatinine, Ser 0.70 0.44 - 1.00 mg/dL   Calcium 8.9 8.9 - 10.3 mg/dL   Total Protein 7.0 6.5 - 8.1 g/dL   Albumin 3.0 (L) 3.5 - 5.0 g/dL   AST 22 15 - 41 U/L   ALT 27 0 - 44 U/L   Alkaline Phosphatase 88 38 - 126 U/L   Total Bilirubin 0.3 0.3 - 1.2 mg/dL   GFR, Estimated >60 >60 mL/min    Comment: (NOTE) Calculated using the CKD-EPI Creatinine Equation (2021)    Anion gap 9 5 - 15    Comment: Performed at [facility] [address] [place], [place] [address]  CBC     Status: Abnormal   Collection Time: 08/26/21  2:55 AM  Result Value Ref Range   WBC 13.2 (H) 4.0 - 10.5 K/uL   RBC 4.32 3.87 - 5.11 MIL/uL   Hemoglobin 10.7 (L) 12.0 - 15.0 g/dL   HCT 34.1 (L) 36.0 - 46.0 %   MCV 78.9 (L) 80.0 - 100.0 fL   MCH 24.8 (L) 26.0 - 34.0 pg   MCHC 31.4 30.0 - 36.0 g/dL   RDW 14.6 11.5 - 15.5 %   Platelets 362 150 - 400 K/uL   nRBC 0.0 0.0 - 0.2 %    Comment: Performed at [facility], [place] [address], [place], [place] [address]    MR Abdomen W or Wo Contrast  Result Date: 08/26/2021 CLINICAL DATA:  26 year old female history of indeterminate liver lesion noted on prior abdominal ultrasound. Follow-up study. EXAM: MRI ABDOMEN WITHOUT AND WITH CONTRAST TECHNIQUE: Multiplanar multisequence MR imaging of the abdomen was performed both before and after the administration of intravenous contrast. CONTRAST:  47m GADAVIST GADOBUTROL 1 MMOL/ML IV SOLN COMPARISON:  No prior abdominal MRI. Abdominal ultrasound 08/25/2021. FINDINGS: Lower chest: Unremarkable. Hepatobiliary: Diffuse loss of signal intensity throughout the hepatic parenchyma on out of phase dual echo images, indicative of a background of hepatic steatosis. The lesion of concern in the left lobe of the liver is centered predominantly  in segment 2 (axial image 50 of series 902 and  coronal image 76 of series 10) estimated to measure approximately 4.3 x 3.1 x 3.0 cm. This lesion is very difficult to visualize on precontrast T1 and T2 weighted images, nearly isointense to the liver on all pulse sequences. On post gadolinium imaging the lesion initially appears predominantly hypovascular with some very heterogeneous areas of internal enhancement and enhancing nodularity, and progressively becomes more occult on delayed imaging similar to background blood pool. No other suspicious hepatic lesions are noted. No intra or extrahepatic biliary ductal dilatation. Common bile duct measures 2 mm in the porta hepatis. No filling defect within the common bile duct to suggest choledocholithiasis. There is some amorphous material lying dependently in the gallbladder which is mildly T2 hypointense, likely to reflect biliary sludge. Gallbladder appears moderately distended. Gallbladder wall appears thickened and edematous, with a trace amount of surrounding T2 signal intensity which suggests acute inflammation. Pancreas: No pancreatic mass. No pancreatic ductal dilatation. No pancreatic or peripancreatic fluid collections or inflammatory changes. Spleen:  Unremarkable. Adrenals/Urinary Tract: Bilateral kidneys and adrenal glands are normal in appearance. No hydroureteronephrosis in the visualized portions of the abdomen. Stomach/Bowel: Increased T2 signal intensity adjacent to the duodenum, likely related to acute gallbladder inflammation. Visualized portions are otherwise unremarkable. Vascular/Lymphatic: No aneurysm identified in the visualized abdominal vasculature. No lymphadenopathy noted in the abdomen. Other: Trace amount of T2 signal intensity in the right upper quadrant centered around the gallbladder and tracking caudally adjacent to the second portion of the duodenum. No large volume of ascites noted in the visualized portions of the peritoneal cavity. Musculoskeletal: No aggressive appearing  osseous lesions are noted in the visualized portions of the skeleton. IMPRESSION: 1. Biliary sludge lying dependently in the gallbladder with moderate distension of the gallbladder, thickening and edema in the gallbladder wall, and small amount of surrounding inflammation; imaging findings indicative of acute cholecystitis. 2. No choledocholithiasis or findings of biliary tract obstruction. 3. 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 This is favored to represent a benign finding, however, attention on repeat abdominal MRI with and without IV gadolinium (preferably Eovist) is recommended in 3-6 months to re-evaluate this lesion and ensure stability. 4. Hepatic steatosis. These results will be called to the ordering clinician or representative by the Radiologist Assistant, and communication documented in the PACS or [employer]. Electronically Signed   By: Vinnie Langton M.D.   On: 08/26/2021 05:43   US Abdomen Limited RUQ (LIVER/GB)  Result Date: 08/25/2021 CLINICAL DATA:  Provided history: Abdominal pain for 5 days, worsening today. EXAM: ULTRASOUND ABDOMEN LIMITED RIGHT UPPER QUADRANT COMPARISON:  None Available. FINDINGS: Gallbladder: Cholecystolithiasis. Additional non-mobile shadowing foci within the gallbladder, which likely reflect adherent gallstones in a patient of this age. Mildly thickened appearance of the gallbladder wall (measuring 4-5 mm). However, the gallbladder is underdistended. No sonographic Murphy sign reported by the scanning technologist. Common bile duct: Diameter: 6-7 mm, mildly dilated. Liver: 3.5 x 2.9 x 4.9 cm hypoechoic mass with associated color Doppler flow in the left hepatic lobe. Background significantly increased hepatic parenchymal echogenicity.Portal vein is patent on color Doppler imaging with normal direction of blood flow towards the liver. IMPRESSION:  Cholecystolithiasis. Additionally, there is a mildly thickened appearance of the gallbladder wall (measuring 4-5 mm). However, the gallbladder is somewhat underdistended potentially accounting for this finding. Mildly dilated common bile duct (6-7 mm in diameter). 3.5 x  3.9 x 4.9 cm hypoechoic mass with associated color Doppler flow in the left hepatic lobe. Background prominent hepatic steatosis. Given the left hepatic lobe mass, apparent severe hepatic steatosis and mild common bile duct dilation, an MRI without and with contrast and MRCP are recommended for further evaluation. This may be non-emergent, if clinically appropriate. Electronically Signed   By: Kellie Simmering D.O.   On: 08/25/2021 18:45    Review of Systems  Constitutional: Negative.   HENT: Negative.    Eyes: Negative.   Respiratory: Negative.    Cardiovascular: Negative.   Gastrointestinal:  Positive for abdominal pain.  Genitourinary: Negative.   Musculoskeletal: Negative.   Skin: Negative.   Neurological: Negative.   Endo/Heme/Allergies: Negative.   Psychiatric/Behavioral: Negative.     PE Blood pressure 128/65, pulse 76, temperature 98.3 F (36.8 C), temperature source Oral, resp. rate 18, weight 112 kg, last menstrual period 08/04/2021, SpO2 97 %. Constitutional: NAD; conversant; no deformities Eyes: Moist conjunctiva; no lid lag; anicteric; PERRL Neck: Trachea midline; no thyromegaly Lungs: Normal respiratory effort; no tactile fremitus CV: RRR; no palpable thrills; no pitting edema GI: Abd tender in epigastrium; no palpable hepatosplenomegaly MSK: Normal gait; no clubbing/cyanosis Psychiatric: Appropriate affect; alert and oriented x3 Lymphatic: No palpable cervical or axillary lymphadenopathy Skin: No major subcutaneous nodules. Warm and dry   Assessment/Plan: 26 yo female with gallstones and hemangioma on imaging. MRI negative for choledocholithiasis. -IV abx -plan for surgery today -We discussed the etiology  of her pain, we discussed treatment options and recommended surgery. We discussed details of surgery including general anesthesia, laparoscopic approach, identification of cystic duct and common bile duct. Ligation of cystic duct and cystic artery. Possible need for intraoperative cholangiogram or open procedure. Possible risks of common bile duct injury, liver injury, cystic duct leak, bleeding, infection, post-cholecystectomy syndrome. The patient showed good understanding and all questions were answered -possible same day discharge  I reviewed last 24 h vitals and pain scores, last 48 h intake and output, last 24 h labs and trends, and last 24 h imaging results.  This care required high  level of medical decision making.   Arta Bruce Emmah Bratcher 08/26/2021, 7:57 AM

## 2021-08-26 NOTE — Progress Notes (Signed)
Pt off the unit with transport to MRI °

## 2021-08-26 NOTE — Progress Notes (Signed)
Pt back from MRI 

## 2021-08-26 NOTE — Anesthesia Preprocedure Evaluation (Addendum)
Anesthesia Evaluation  Patient identified by MRN, date of birth, ID band Patient awake    Reviewed: Allergy & Precautions, NPO status , Patient's Chart, lab work & pertinent test results  Airway Mallampati: II  TM Distance: >3 FB Neck ROM: Full    Dental no notable dental hx.    Pulmonary neg pulmonary ROS,    Pulmonary exam normal breath sounds clear to auscultation       Cardiovascular negative cardio ROS Normal cardiovascular exam Rhythm:Regular Rate:Normal     Neuro/Psych negative neurological ROS  negative psych ROS   GI/Hepatic negative GI ROS, Neg liver ROS,   Endo/Other  Morbid obesity  Renal/GU negative Renal ROS  negative genitourinary   Musculoskeletal negative musculoskeletal ROS (+)   Abdominal   Peds negative pediatric ROS (+)  Hematology negative hematology ROS (+)   Anesthesia Other Findings   Reproductive/Obstetrics negative OB ROS                             Anesthesia Physical Anesthesia Plan  ASA: 2  Anesthesia Plan: General   Post-op Pain Management:    Induction: Intravenous  PONV Risk Score and Plan: 3  Airway Management Planned:   Additional Equipment:   Intra-op Plan:   Post-operative Plan: Extubation in OR  Informed Consent: I have reviewed the patients History and Physical, chart, labs and discussed the procedure including the risks, benefits and alternatives for the proposed anesthesia with the patient or authorized representative who has indicated his/her understanding and acceptance.     Dental advisory given  Plan Discussed with: CRNA, Anesthesiologist and Surgeon  Anesthesia Plan Comments:        Anesthesia Quick Evaluation

## 2021-08-26 NOTE — Anesthesia Procedure Notes (Signed)
Procedure Name: Intubation Date/Time: 08/26/2021 11:29 AM Performed by: Valda Favia, CRNA Pre-anesthesia Checklist: Patient identified, Emergency Drugs available, Suction available and Patient being monitored Patient Re-evaluated:Patient Re-evaluated prior to induction Oxygen Delivery Method: Circle System Utilized Preoxygenation: Pre-oxygenation with 100% oxygen Induction Type: IV induction Ventilation: Mask ventilation without difficulty and Oral airway inserted - appropriate to patient size Laryngoscope Size: Mac and 4 Grade View: Grade I Tube type: Oral Tube size: 7.0 mm Number of attempts: 1 Airway Equipment and Method: Stylet and Oral airway Placement Confirmation: ETT inserted through vocal cords under direct vision, positive ETCO2 and breath sounds checked- equal and bilateral Secured at: 22 cm Tube secured with: Tape Dental Injury: Teeth and Oropharynx as per pre-operative assessment

## 2021-08-26 NOTE — Assessment & Plan Note (Signed)
Patient currently working towards weight loss and has lost 3 pounds recently.

## 2021-08-26 NOTE — Assessment & Plan Note (Signed)
Mass seen in the left hepatic lobe on RUQ ultrasound.  MRCP is pending.

## 2021-08-26 NOTE — Assessment & Plan Note (Signed)
Encourage weight loss.  Patient reports that she has been working towards weight loss and has lost 3 pounds recently.

## 2021-08-26 NOTE — Progress Notes (Signed)
Pharmacy Antibiotic Note  Amanda [name] is a 26 y.o. female admitted on 08/25/2021 with  cholecystolithiasis .  Pharmacy has been consulted for Zosyn dosing. WBC is elevated. Renal function good.   Plan: Zosyn 3.375G IV q8h to be infused over 4 hours Trend WBC, temp, renal function  F/U infectious work-up  Weight: 112 kg (247 lb)  Temp (24hrs), Avg:97.9 F (36.6 C), Min:97.1 F (36.2 C), Max:98.4 F (36.9 C)  Recent Labs  Lab 08/25/21 1738  WBC 15.7*  CREATININE 0.58    CrCl cannot be calculated (Unknown ideal weight.).    No Known Allergies  Narda Bonds, PharmD, BCPS Clinical Pharmacist Phone: [phone]

## 2021-08-26 NOTE — Progress Notes (Signed)
PROGRESS NOTE        PATIENT DETAILS Name: Amanda [name] Age: 26 y.o. Sex: female Date of Birth: January 17, 1996 Admit Date: 08/25/2021 Admitting Physician Vianne Bulls, MD [id number], No Pcp Per (Inactive)  Brief Summary: Patient is a 26 y.o.  female with no past medical history-presenting with RUQ pain and leukocytosis-thought to have acute calculous cholecystitis.   Significant events: 6/1>> admit to [facility] for acute calculus cholecystitis.  Significant studies: 6/1>> RUQ ultrasound: Cholelithiasis-thickened gallbladder-mildly dilated CBD, 3.5 x 3.9 x 4.9 cm hypoechoic mass left hepatic lobe. 6/2>> MRI abdomen: Findings consistent with acute cholecystitis-left lobe liver lesion seen on ultrasound-has characteristics of cavernous hemangioma.  Significant microbiology data: None  Procedures: None  Consults: General surgery  Subjective: Slightly better but continues to have RUQ pain  Objective: Vitals: Blood pressure 140/81, pulse 75, temperature 97.9 F (36.6 C), temperature source Oral, resp. rate 17, height '5\' 6"'$  (1.676 m), weight 112 kg, last menstrual period 08/04/2021, SpO2 97 %.   Exam: Gen Exam:Alert awake-not in any distress HEENT:atraumatic, normocephalic Chest: B/L clear to auscultation anteriorly CVS:S1S2 regular Abdomen: RUQ pain continues. Extremities:no edema Neurology: Non focal Skin: no rash  Pertinent Labs/Radiology:    Latest Ref Rng & Units 08/26/2021    2:55 AM 08/25/2021    5:38 PM  CBC  WBC 4.0 - 10.5 K/uL 13.2   15.7    Hemoglobin 12.0 - 15.0 g/dL 10.7   11.0    Hematocrit 36.0 - 46.0 % 34.1   35.3    Platelets 150 - 400 K/uL 362   381      Lab Results  Component Value Date   NA 139 08/26/2021   K 4.2 08/26/2021   CL 106 08/26/2021   CO2 24 08/26/2021      Assessment/Plan: Acute calculus cholecystitis: Continue IV antibiotics-General surgery following-laparoscopic cholecystectomy planned for later  today.  Left lobe liver lesion-likely hemangioma: Repeat MRI in the outpatient setting.    Obesity: Estimated body mass index is 39.87 kg/m as calculated from the following:   Height as of this encounter: '5\' 6"'$  (1.676 m).   Weight as of this encounter: 112 kg.   Code status:   Code Status: Full Code   DVT Prophylaxis: SCDs Start: 08/26/21 0035   Family Communication: None at bedside   Disposition Plan: Status is: Observation The patient will require care spanning > 2 midnights and should be moved to inpatient because: Acute cholecystitis-for laparoscopic cholecystectomy later today.  Not stable for discharge.   Planned Discharge Destination:Home probably on 6/3 if cleared by general surgery.   Diet: Diet Order             Diet NPO time specified Except for: Sips with Meds, Ice Chips  Diet effective now                     Antimicrobial agents: Anti-infectives (From admission, onward)    Start     Dose/Rate Route Frequency Ordered Stop   08/26/21 0200  [MAR Hold]  piperacillin-tazobactam (ZOSYN) IVPB 3.375 g        (MAR Hold since Fri 08/26/2021 at 1051.Hold Reason: Transfer to a Procedural area)   3.375 g 12.5 mL/hr over 240 Minutes Intravenous Every 8 hours 08/26/21 0105     08/25/21 1915  piperacillin-tazobactam (ZOSYN) IVPB 3.375 g  3.375 g 100 mL/hr over 30 Minutes Intravenous  Once 08/25/21 1903 08/25/21 1948        MEDICATIONS: Scheduled Meds:  acetaminophen       scopolamine  1 patch Transdermal Q72H   scopolamine       Continuous Infusions:  lactated ringers 10 mL/hr at 08/26/21 1106   [MAR Hold] piperacillin-tazobactam (ZOSYN)  IV 3.375 g (08/26/21 0135)   PRN Meds:.[MAR Hold] ibuprofen   I have personally reviewed following labs and imaging studies  LABORATORY DATA: CBC: Recent Labs  Lab 08/25/21 1738 08/26/21 0255  WBC 15.7* 13.2*  HGB 11.0* 10.7*  HCT 35.3* 34.1*  MCV 77.8* 78.9*  PLT 381 371    Basic Metabolic  Panel: Recent Labs  Lab 08/25/21 1738 08/26/21 0255  NA 139 139  K 4.4 4.2  CL 101 106  CO2 25 24  GLUCOSE 89 97  BUN 16 14  CREATININE 0.58 0.70  CALCIUM 9.7 8.9    GFR: Estimated Creatinine Clearance: 134.1 mL/min (by C-G formula based on SCr of 0.7 mg/dL).  Liver Function Tests: Recent Labs  Lab 08/25/21 1738 08/26/21 0255  AST 14* 22  ALT 20 27  ALKPHOS 84 88  BILITOT 0.3 0.3  PROT 8.1 7.0  ALBUMIN 4.0 3.0*   Recent Labs  Lab 08/25/21 1738  LIPASE <10*   No results for input(s): AMMONIA in the last 168 hours.  Coagulation Profile: No results for input(s): INR, PROTIME in the last 168 hours.  Cardiac Enzymes: No results for input(s): CKTOTAL, CKMB, CKMBINDEX, TROPONINI in the last 168 hours.  BNP (last 3 results) No results for input(s): PROBNP in the last 8760 hours.  Lipid Profile: No results for input(s): CHOL, HDL, LDLCALC, TRIG, CHOLHDL, LDLDIRECT in the last 72 hours.  Thyroid Function Tests: No results for input(s): TSH, T4TOTAL, FREET4, T3FREE, THYROIDAB in the last 72 hours.  Anemia Panel: No results for input(s): VITAMINB12, FOLATE, FERRITIN, TIBC, IRON, RETICCTPCT in the last 72 hours.  Urine analysis:    Component Value Date/Time   COLORURINE YELLOW 08/25/2021 1744   APPEARANCEUR HAZY (A) 08/25/2021 1744   LABSPEC 1.031 (H) 08/25/2021 1744   PHURINE 5.5 08/25/2021 1744   GLUCOSEU NEGATIVE 08/25/2021 1744   HGBUR MODERATE (A) 08/25/2021 1744   BILIRUBINUR NEGATIVE 08/25/2021 1744   KETONESUR NEGATIVE 08/25/2021 1744   PROTEINUR TRACE (A) 08/25/2021 1744   NITRITE NEGATIVE 08/25/2021 1744   LEUKOCYTESUR MODERATE (A) 08/25/2021 1744    Sepsis Labs: Lactic Acid, Venous No results found for: LATICACIDVEN  MICROBIOLOGY: No results found for this or any previous visit (from the past 240 hour(s)).  RADIOLOGY STUDIES/RESULTS: MR Abdomen W or Wo Contrast  Result Date: 08/26/2021 CLINICAL DATA:  26 year old female history of  indeterminate liver lesion noted on prior abdominal ultrasound. Follow-up study. EXAM: MRI ABDOMEN WITHOUT AND WITH CONTRAST TECHNIQUE: Multiplanar multisequence MR imaging of the abdomen was performed both before and after the administration of intravenous contrast. CONTRAST:  26m GADAVIST GADOBUTROL 1 MMOL/ML IV SOLN COMPARISON:  No prior abdominal MRI. Abdominal ultrasound 08/25/2021. FINDINGS: Lower chest: Unremarkable. Hepatobiliary: Diffuse loss of signal intensity throughout the hepatic parenchyma on out of phase dual echo images, indicative of a background of hepatic steatosis. The lesion of concern in the left lobe of the liver is centered predominantly in segment 2 (axial image 50 of series 902 and coronal image 76 of series 10) estimated to measure approximately 4.3 x 3.1 x 3.0 cm. This lesion is very difficult to visualize  on precontrast T1 and T2 weighted images, nearly isointense to the liver on all pulse sequences. On post gadolinium imaging the lesion initially appears predominantly hypovascular with some very heterogeneous areas of internal enhancement and enhancing nodularity, and progressively becomes more occult on delayed imaging similar to background blood pool. No other suspicious hepatic lesions are noted. No intra or extrahepatic biliary ductal dilatation. Common bile duct measures 2 mm in the porta hepatis. No filling defect within the common bile duct to suggest choledocholithiasis. There is some amorphous material lying dependently in the gallbladder which is mildly T2 hypointense, likely to reflect biliary sludge. Gallbladder appears moderately distended. Gallbladder wall appears thickened and edematous, with a trace amount of surrounding T2 signal intensity which suggests acute inflammation. Pancreas: No pancreatic mass. No pancreatic ductal dilatation. No pancreatic or peripancreatic fluid collections or inflammatory changes. Spleen:  Unremarkable. Adrenals/Urinary Tract: Bilateral  kidneys and adrenal glands are normal in appearance. No hydroureteronephrosis in the visualized portions of the abdomen. Stomach/Bowel: Increased T2 signal intensity adjacent to the duodenum, likely related to acute gallbladder inflammation. Visualized portions are otherwise unremarkable. Vascular/Lymphatic: No aneurysm identified in the visualized abdominal vasculature. No lymphadenopathy noted in the abdomen. Other: Trace amount of T2 signal intensity in the right upper quadrant centered around the gallbladder and tracking caudally adjacent to the second portion of the duodenum. No large volume of ascites noted in the visualized portions of the peritoneal cavity. Musculoskeletal: No aggressive appearing osseous lesions are noted in the visualized portions of the skeleton. IMPRESSION: 1. Biliary sludge lying dependently in the gallbladder with moderate distension of the gallbladder, thickening and edema in the gallbladder wall, and small amount of surrounding inflammation; imaging findings indicative of acute cholecystitis. 2. No choledocholithiasis or findings of biliary tract obstruction. 3. 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 This is favored to represent a benign finding, however, attention on repeat abdominal MRI with and without IV gadolinium (preferably Eovist) is recommended in 3-6 months to re-evaluate this lesion and ensure stability. 4. Hepatic steatosis. These results will be called to the ordering clinician or representative by the Radiologist Assistant, and communication documented in the PACS or [employer]. Electronically Signed   By: Vinnie Langton M.D.   On: 08/26/2021 05:43   US Abdomen Limited RUQ (LIVER/GB)  Result Date: 08/25/2021 CLINICAL DATA:  Provided history: Abdominal pain for 5 days, worsening today. EXAM: ULTRASOUND ABDOMEN LIMITED RIGHT UPPER QUADRANT COMPARISON:  None  Available. FINDINGS: Gallbladder: Cholecystolithiasis. Additional non-mobile shadowing foci within the gallbladder, which likely reflect adherent gallstones in a patient of this age. Mildly thickened appearance of the gallbladder wall (measuring 4-5 mm). However, the gallbladder is underdistended. No sonographic Murphy sign reported by the scanning technologist. Common bile duct: Diameter: 6-7 mm, mildly dilated. Liver: 3.5 x 2.9 x 4.9 cm hypoechoic mass with associated color Doppler flow in the left hepatic lobe. Background significantly increased hepatic parenchymal echogenicity.Portal vein is patent on color Doppler imaging with normal direction of blood flow towards the liver. IMPRESSION: Cholecystolithiasis. Additionally, there is a mildly thickened appearance of the gallbladder wall (measuring 4-5 mm). However, the gallbladder is somewhat underdistended potentially accounting for this finding. Mildly dilated common bile duct (6-7 mm in diameter). 3.5 x 3.9 x 4.9 cm hypoechoic mass with associated color Doppler flow in the left hepatic lobe. Background prominent hepatic steatosis. Given the left hepatic lobe mass, apparent severe hepatic steatosis and mild common bile  duct dilation, an MRI without and with contrast and MRCP are recommended for further evaluation. This may be non-emergent, if clinically appropriate. Electronically Signed   By: Kellie Simmering D.O.   On: 08/25/2021 18:45     LOS: 0 days   Oren Binet, MD  [facility]    To contact the attending provider between 7A-7P or the covering provider during after hours 7P-7A, please log into the web site [email or web address] and access using universal [place] password for that web site. If you do not have the password, please call the hospital operator.  08/26/2021, 11:34 AM

## 2021-08-26 NOTE — Plan of Care (Signed)

## 2021-08-26 NOTE — Discharge Instructions (Signed)
[facility] LAPAROSCOPIC SURGERY: POST OP INSTRUCTIONS Always review your discharge instruction sheet given to you by the facility where your surgery was performed. IF YOU HAVE DISABILITY OR FAMILY LEAVE FORMS, YOU MUST BRING THEM TO THE OFFICE FOR PROCESSING.   DO NOT GIVE THEM TO YOUR DOCTOR.  PAIN CONTROL  First take acetaminophen (Tylenol) AND/or ibuprofen (Advil) to control your pain after surgery.  Follow directions on package.  Taking acetaminophen (Tylenol) and/or ibuprofen (Advil) regularly after surgery will help to control your pain and lower the amount of prescription pain medication you may need.  You should not take more than 3,000 mg (3 grams) of acetaminophen (Tylenol) in 24 hours.  You should not take ibuprofen (Advil), aleve, motrin, naprosyn or other NSAIDS if you have a history of stomach ulcers or chronic kidney disease.  A prescription for pain medication may be given to you upon discharge.  Take your pain medication as prescribed, if you still have uncontrolled pain after taking acetaminophen (Tylenol) or ibuprofen (Advil). Use ice packs to help control pain. If you need a refill on your pain medication, please contact your pharmacy.  They will contact our office to request authorization. Prescriptions will not be filled after 5pm or on week-ends.  HOME MEDICATIONS Take your usually prescribed medications unless otherwise directed.  DIET You should follow a light diet the first few days after arrival home.  Be sure to include lots of fluids daily. Avoid fatty, fried foods.   CONSTIPATION It is common to experience some constipation after surgery and if you are taking pain medication.  Increasing fluid intake and taking a stool softener (such as Colace) will usually help or prevent this problem from occurring.  A mild laxative (Milk of Magnesia or Miralax) should be taken according to package instructions if there are no bowel movements after 48  hours.  WOUND/INCISION CARE Most patients will experience some swelling and bruising in the area of the incisions.  Ice packs will help.  Swelling and bruising can take several days to resolve.  Unless discharge instructions indicate otherwise, follow guidelines below  STERI-STRIPS - you may remove your outer bandages 48 hours after surgery, and you may shower at that time.  You have steri-strips (small skin tapes) in place directly over the incision.  These strips should be left on the skin for 7-10 days.   DERMABOND/SKIN GLUE - you may shower in 24 hours.  The glue will flake off over the next 2-3 weeks. Any sutures or staples will be removed at the office during your follow-up visit.  ACTIVITIES You may resume regular (light) daily activities beginning the next day--such as daily self-care, walking, climbing stairs--gradually increasing activities as tolerated.  You may have sexual intercourse when it is comfortable.  Refrain from any heavy lifting or straining until approved by your doctor. You may drive when you are no longer taking prescription pain medication, you can comfortably wear a seatbelt, and you can safely maneuver your car and apply brakes.  FOLLOW-UP You should see your doctor in the office for a follow-up appointment approximately 2-3 weeks after your surgery.  You should have been given your post-op/follow-up appointment when your surgery was scheduled.  If you did not receive a post-op/follow-up appointment, make sure that you call for this appointment within a day or two after you arrive home to insure a convenient appointment time.   WHEN TO CALL YOUR DOCTOR: Fever over 101.0 Inability to urinate Continued bleeding from incision.   Increased pain, redness, or drainage from the incision. Increasing abdominal pain  The clinic staff is available to answer your questions during regular business hours.  Please don't hesitate to call and ask to speak to one of the nurses for  clinical concerns.  If you have a medical emergency, go to the nearest emergency room or call 911.  A surgeon from [facility] is always on call at the hospital. [address], [address], [place], [place]  [address] ? [address] [address], [place], [place]   [address] [phone] ? [phone] ? FAX [phone] Web site: [email or web address]      Managing Your Pain After Surgery Without Opioids    Thank you for participating in our program to help patients manage their pain after surgery without opioids. This is part of our effort to provide you with the best care possible, without exposing you or your family to the risk that opioids pose.  What pain can I expect after surgery? You can expect to have some pain after surgery. This is normal. The pain is typically worse the day after surgery, and quickly begins to get better. Many studies have found that many patients are able to manage their pain after surgery with Over-the-Counter (OTC) medications such as Tylenol and Motrin. If you have a condition that does not allow you to take Tylenol or Motrin, notify your surgical team.  How will I manage my pain? The best strategy for controlling your pain after surgery is around the clock pain control with Tylenol (acetaminophen) and Motrin (ibuprofen or Advil). Alternating these medications with each other allows you to maximize your pain control. In addition to Tylenol and Motrin, you can use heating pads or ice packs on your incisions to help reduce your pain.  How will I alternate your regular strength over-the-counter pain medication? You will take a dose of pain medication every three hours. Start by taking 650 mg of Tylenol (2 pills of 325 mg) 3 hours later take 600 mg of Motrin (3 pills of 200 mg) 3 hours after taking the Motrin take 650 mg of Tylenol 3 hours after that take 600 mg of Motrin.   - 1 -  See example - if your first dose of Tylenol is at 12:00  PM   12:00 PM Tylenol 650 mg (2 pills of 325 mg)  3:00 PM Motrin 600 mg (3 pills of 200 mg)  6:00 PM Tylenol 650 mg (2 pills of 325 mg)  9:00 PM Motrin 600 mg (3 pills of 200 mg)  Continue alternating every 3 hours   We recommend that you follow this schedule around-the-clock for at least 3 days after surgery, or until you feel that it is no longer needed. Use the table on the last page of this handout to keep track of the medications you are taking. Important: Do not take more than 3000mg of Tylenol or 3200mg of Motrin in a 24-hour period. Do not take ibuprofen/Motrin if you have a history of bleeding stomach ulcers, severe kidney disease, &/or actively taking a blood thinner  What if I still have pain? If you have pain that is not controlled with the over-the-counter pain medications (Tylenol and Motrin or Advil) you might have what we call "breakthrough" pain. You will receive a prescription for a small amount of an opioid pain medication such as Oxycodone, Tramadol, or Tylenol with Codeine. Use these opioid pills in the first 24 hours after surgery if you have breakthrough pain. Do   not take more than 1 pill every 4-6 hours.  If you still have uncontrolled pain after using all opioid pills, don't hesitate to call our staff using the number provided. We will help make sure you are managing your pain in the best way possible, and if necessary, we can provide a prescription for additional pain medication.   Day 1    Time  Name of Medication Number of pills taken  Amount of Acetaminophen  Pain Level   Comments  AM PM       AM PM       AM PM       AM PM       AM PM       AM PM       AM PM       AM PM       Total Daily amount of Acetaminophen Do not take more than  3,000 mg per day      Day 2    Time  Name of Medication Number of pills taken  Amount of Acetaminophen  Pain Level   Comments  AM PM       AM PM       AM PM       AM PM       AM PM       AM PM       AM  PM       AM PM       Total Daily amount of Acetaminophen Do not take more than  3,000 mg per day      Day 3    Time  Name of Medication Number of pills taken  Amount of Acetaminophen  Pain Level   Comments  AM PM       AM PM       AM PM       AM PM          AM PM       AM PM       AM PM       AM PM       Total Daily amount of Acetaminophen Do not take more than  3,000 mg per day      Day 4    Time  Name of Medication Number of pills taken  Amount of Acetaminophen  Pain Level   Comments  AM PM       AM PM       AM PM       AM PM       AM PM       AM PM       AM PM       AM PM       Total Daily amount of Acetaminophen Do not take more than  3,000 mg per day      Day 5    Time  Name of Medication Number of pills taken  Amount of Acetaminophen  Pain Level   Comments  AM PM       AM PM       AM PM       AM PM       AM PM       AM PM       AM PM       AM PM       Total Daily amount of Acetaminophen Do not take more than    3,000 mg per day       Day 6    Time  Name of Medication Number of pills taken  Amount of Acetaminophen  Pain Level  Comments  AM PM       AM PM       AM PM       AM PM       AM PM       AM PM       AM PM       AM PM       Total Daily amount of Acetaminophen Do not take more than  3,000 mg per day      Day 7    Time  Name of Medication Number of pills taken  Amount of Acetaminophen  Pain Level   Comments  AM PM       AM PM       AM PM       AM PM       AM PM       AM PM       AM PM       AM PM       Total Daily amount of Acetaminophen Do not take more than  3,000 mg per day        For additional information about how and where to safely dispose of unused opioid medications - https://www.morepowerfulnc.org  Disclaimer: This document contains information and/or instructional materials adapted from [facility] for the typical patient with your condition. It does not replace medical advice  from your health care provider because your experience may differ from that of the typical patient. Talk to your health care provider if you have any questions about this document, your condition or your treatment plan. Adapted from [facility]  

## 2021-08-27 MED ORDER — OXYCODONE HCL 5 MG PO TABS: 5.0000 mg | ORAL_TABLET | Freq: Four times a day (QID) | ORAL | 0 refills | Status: DC | PRN

## 2021-08-27 NOTE — Progress Notes (Signed)
D/w Gen surgery-[facility] will assume primary service. [facility] will sign off.

## 2021-08-27 NOTE — Discharge Summary (Signed)
[facility] Discharge Summary   Patient ID: Amanda [name] MRN: [id number] DOB/AGE: 21-Sep-1995 26 y.o.  Admit date: 08/25/2021 Discharge date: 08/27/2021  Admitting Diagnosis: Cholelithiasis Liver hemangioma   Discharge Diagnosis S/P laparoscopic cholecystectomy   Consultants Internal medicine   Imaging: MR Abdomen W or Wo Contrast  Result Date: 08/26/2021 CLINICAL DATA:  26 year old female history of indeterminate liver lesion noted on prior abdominal ultrasound. Follow-up study. EXAM: MRI ABDOMEN WITHOUT AND WITH CONTRAST TECHNIQUE: Multiplanar multisequence MR imaging of the abdomen was performed both before and after the administration of intravenous contrast. CONTRAST:  41m GADAVIST GADOBUTROL 1 MMOL/ML IV SOLN COMPARISON:  No prior abdominal MRI. Abdominal ultrasound 08/25/2021. FINDINGS: Lower chest: Unremarkable. Hepatobiliary: Diffuse loss of signal intensity throughout the hepatic parenchyma on out of phase dual echo images, indicative of a background of hepatic steatosis. The lesion of concern in the left lobe of the liver is centered predominantly in segment 2 (axial image 50 of series 902 and coronal image 76 of series 10) estimated to measure approximately 4.3 x 3.1 x 3.0 cm. This lesion is very difficult to visualize on precontrast T1 and T2 weighted images, nearly isointense to the liver on all pulse sequences. On post gadolinium imaging the lesion initially appears predominantly hypovascular with some very heterogeneous areas of internal enhancement and enhancing nodularity, and progressively becomes more occult on delayed imaging similar to background blood pool. No other suspicious hepatic lesions are noted. No intra or extrahepatic biliary ductal dilatation. Common bile duct measures 2 mm in the porta hepatis. No filling defect within the common bile duct to suggest choledocholithiasis. There is some amorphous material lying dependently in the gallbladder which is  mildly T2 hypointense, likely to reflect biliary sludge. Gallbladder appears moderately distended. Gallbladder wall appears thickened and edematous, with a trace amount of surrounding T2 signal intensity which suggests acute inflammation. Pancreas: No pancreatic mass. No pancreatic ductal dilatation. No pancreatic or peripancreatic fluid collections or inflammatory changes. Spleen:  Unremarkable. Adrenals/Urinary Tract: Bilateral kidneys and adrenal glands are normal in appearance. No hydroureteronephrosis in the visualized portions of the abdomen. Stomach/Bowel: Increased T2 signal intensity adjacent to the duodenum, likely related to acute gallbladder inflammation. Visualized portions are otherwise unremarkable. Vascular/Lymphatic: No aneurysm identified in the visualized abdominal vasculature. No lymphadenopathy noted in the abdomen. Other: Trace amount of T2 signal intensity in the right upper quadrant centered around the gallbladder and tracking caudally adjacent to the second portion of the duodenum. No large volume of ascites noted in the visualized portions of the peritoneal cavity. Musculoskeletal: No aggressive appearing osseous lesions are noted in the visualized portions of the skeleton. IMPRESSION: 1. Biliary sludge lying dependently in the gallbladder with moderate distension of the gallbladder, thickening and edema in the gallbladder wall, and small amount of surrounding inflammation; imaging findings indicative of acute cholecystitis. 2. No choledocholithiasis or findings of biliary tract obstruction. 3. 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 This is favored to represent a benign finding, however, attention on repeat abdominal MRI with and without IV gadolinium (preferably Eovist) is recommended in 3-6 months to re-evaluate this lesion and ensure stability. 4. Hepatic steatosis. These results will be  called to the ordering clinician or representative by the Radiologist Assistant, and communication documented in the PACS or [employer] Electronically Signed   By: DVinnie LangtonM.D.   On: 08/26/2021 05:43   UKoreaAbdomen Limited RUQ (LIVER/GB)  Result Date: 08/25/2021 CLINICAL DATA:  Provided history: Abdominal pain for 5 days, worsening today. EXAM: ULTRASOUND ABDOMEN LIMITED RIGHT UPPER QUADRANT COMPARISON:  None Available. FINDINGS: Gallbladder: Cholecystolithiasis. Additional non-mobile shadowing foci within the gallbladder, which likely reflect adherent gallstones in a patient of this age. Mildly thickened appearance of the gallbladder wall (measuring 4-5 mm). However, the gallbladder is underdistended. No sonographic Murphy sign reported by the scanning technologist. Common bile duct: Diameter: 6-7 mm, mildly dilated. Liver: 3.5 x 2.9 x 4.9 cm hypoechoic mass with associated color Doppler flow in the left hepatic lobe. Background significantly increased hepatic parenchymal echogenicity.Portal vein is patent on color Doppler imaging with normal direction of blood flow towards the liver. IMPRESSION: Cholecystolithiasis. Additionally, there is a mildly thickened appearance of the gallbladder wall (measuring 4-5 mm). However, the gallbladder is somewhat underdistended potentially accounting for this finding. Mildly dilated common bile duct (6-7 mm in diameter). 3.5 x 3.9 x 4.9 cm hypoechoic mass with associated color Doppler flow in the left hepatic lobe. Background prominent hepatic steatosis. Given the left hepatic lobe mass, apparent severe hepatic steatosis and mild common bile duct dilation, an MRI without and with contrast and MRCP are recommended for further evaluation. This may be non-emergent, if clinically appropriate. Electronically Signed   By: Kellie Simmering D.O.   On: 08/25/2021 18:45    Procedures Dr. Lurena Joiner Kinsinger (08/26/21) - Laparoscopic Cholecystectomy   Hospital Course:  Patient  is a 26 year old female who presented to the ED with abdominal pain.  Workup showed cholelithiasis and left hepatic lobe mass. MRI showed liver mass to be a hemangioma.  Patient was admitted and underwent procedure listed above.  Tolerated procedure well and was transferred to the floor.  Diet was advanced as tolerated.  On POD1, the patient was voiding well, tolerating diet, ambulating well, pain well controlled, vital signs stable, incisions c/d/i and felt stable for discharge home.  Patient will follow up in our office in 3 weeks and knows to call with questions or concerns.  She will call to confirm appointment date/time.    I or a member of my team have reviewed this patient in the Controlled Substance Database.   Allergies as of 08/27/2021   No Known Allergies      Medication List     TAKE these medications    ibuprofen 200 MG tablet Commonly known as: ADVIL Take 600 mg by mouth every 6 (six) hours as needed.   oxyCODONE 5 MG immediate release tablet Commonly known as: Oxy IR/ROXICODONE Take 1 tablet (5 mg total) by mouth every 6 (six) hours as needed for moderate pain.   Sprintec 28 0.25-35 MG-MCG tablet Generic drug: norgestimate-ethinyl estradiol Take 1 tablet by mouth daily.          Follow-up Information     [facility]. Go on 09/20/2021.   Specialty: General Surgery Why: 8:45 AM with Malachi Pro, PA-C. Please arrive 30 min prior to appointment time and have ID and insurance card with you. Contact information: [address] [place] [address] [phone]                 Signed: Norm Parcel , [facility] 08/27/2021, 10:40 AM Please see Amion for pager number during day hours 7:00am-4:30pm

## 2021-08-27 NOTE — Progress Notes (Signed)
1 Day Post-Op   Subjective/Chief Complaint: Complains of abd soreness but it seems manageable   Objective: Vital signs in last 24 hours: Temp:  [97.9 F (36.6 C)-98.1 F (36.7 C)] 98.1 F (36.7 C) (06/03 0312) Pulse Rate:  [69-99] 69 (06/03 0312) Resp:  [16-20] 16 (06/03 0312) BP: (112-153)/(58-92) 135/76 (06/03 0312) SpO2:  [93 %-97 %] 93 % (06/03 0312) Weight:  [440 kg] 112 kg (06/02 1054) Last BM Date :  (PTA)  Intake/Output from previous day: 06/02 0701 - 06/03 0700 In: 700 [I.V.:700] Out: 30 [Blood:30] Intake/Output this shift: No intake/output data recorded.  General appearance: alert and cooperative Resp: clear to auscultation bilaterally Cardio: regular rate and rhythm GI: soft, mild tenderness. Incisions look good  Lab Results:  Recent Labs    08/25/21 1738 08/26/21 0255  WBC 15.7* 13.2*  HGB 11.0* 10.7*  HCT 35.3* 34.1*  PLT 381 362   BMET Recent Labs    08/25/21 1738 08/26/21 0255  NA 139 139  K 4.4 4.2  CL 101 106  CO2 25 24  GLUCOSE 89 97  BUN 16 14  CREATININE 0.58 0.70  CALCIUM 9.7 8.9   PT/INR No results for input(s): LABPROT, INR in the last 72 hours. ABG No results for input(s): PHART, HCO3 in the last 72 hours.  Invalid input(s): PCO2, PO2  Studies/Results: MR Abdomen W or Wo Contrast  Result Date: 08/26/2021 CLINICAL DATA:  26 year old female history of indeterminate liver lesion noted on prior abdominal ultrasound. Follow-up study. EXAM: MRI ABDOMEN WITHOUT AND WITH CONTRAST TECHNIQUE: Multiplanar multisequence MR imaging of the abdomen was performed both before and after the administration of intravenous contrast. CONTRAST:  71m GADAVIST GADOBUTROL 1 MMOL/ML IV SOLN COMPARISON:  No prior abdominal MRI. Abdominal ultrasound 08/25/2021. FINDINGS: Lower chest: Unremarkable. Hepatobiliary: Diffuse loss of signal intensity throughout the hepatic parenchyma on out of phase dual echo images, indicative of a background of hepatic  steatosis. The lesion of concern in the left lobe of the liver is centered predominantly in segment 2 (axial image 50 of series 902 and coronal image 76 of series 10) estimated to measure approximately 4.3 x 3.1 x 3.0 cm. This lesion is very difficult to visualize on precontrast T1 and T2 weighted images, nearly isointense to the liver on all pulse sequences. On post gadolinium imaging the lesion initially appears predominantly hypovascular with some very heterogeneous areas of internal enhancement and enhancing nodularity, and progressively becomes more occult on delayed imaging similar to background blood pool. No other suspicious hepatic lesions are noted. No intra or extrahepatic biliary ductal dilatation. Common bile duct measures 2 mm in the porta hepatis. No filling defect within the common bile duct to suggest choledocholithiasis. There is some amorphous material lying dependently in the gallbladder which is mildly T2 hypointense, likely to reflect biliary sludge. Gallbladder appears moderately distended. Gallbladder wall appears thickened and edematous, with a trace amount of surrounding T2 signal intensity which suggests acute inflammation. Pancreas: No pancreatic mass. No pancreatic ductal dilatation. No pancreatic or peripancreatic fluid collections or inflammatory changes. Spleen:  Unremarkable. Adrenals/Urinary Tract: Bilateral kidneys and adrenal glands are normal in appearance. No hydroureteronephrosis in the visualized portions of the abdomen. Stomach/Bowel: Increased T2 signal intensity adjacent to the duodenum, likely related to acute gallbladder inflammation. Visualized portions are otherwise unremarkable. Vascular/Lymphatic: No aneurysm identified in the visualized abdominal vasculature. No lymphadenopathy noted in the abdomen. Other: Trace amount of T2 signal intensity in the right upper quadrant centered around the gallbladder and tracking  caudally adjacent to the second portion of the  duodenum. No large volume of ascites noted in the visualized portions of the peritoneal cavity. Musculoskeletal: No aggressive appearing osseous lesions are noted in the visualized portions of the skeleton. IMPRESSION: 1. Biliary sludge lying dependently in the gallbladder with moderate distension of the gallbladder, thickening and edema in the gallbladder wall, and small amount of surrounding inflammation; imaging findings indicative of acute cholecystitis. 2. No choledocholithiasis or findings of biliary tract obstruction. 3. 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 This is favored to represent a benign finding, however, attention on repeat abdominal MRI with and without IV gadolinium (preferably Eovist) is recommended in 3-6 months to re-evaluate this lesion and ensure stability. 4. Hepatic steatosis. These results will be called to the ordering clinician or representative by the Radiologist Assistant, and communication documented in the PACS or [employer]. Electronically Signed   By: Vinnie Langton M.D.   On: 08/26/2021 05:43   US Abdomen Limited RUQ (LIVER/GB)  Result Date: 08/25/2021 CLINICAL DATA:  Provided history: Abdominal pain for 5 days, worsening today. EXAM: ULTRASOUND ABDOMEN LIMITED RIGHT UPPER QUADRANT COMPARISON:  None Available. FINDINGS: Gallbladder: Cholecystolithiasis. Additional non-mobile shadowing foci within the gallbladder, which likely reflect adherent gallstones in a patient of this age. Mildly thickened appearance of the gallbladder wall (measuring 4-5 mm). However, the gallbladder is underdistended. No sonographic Murphy sign reported by the scanning technologist. Common bile duct: Diameter: 6-7 mm, mildly dilated. Liver: 3.5 x 2.9 x 4.9 cm hypoechoic mass with associated color Doppler flow in the left hepatic lobe. Background significantly increased hepatic parenchymal  echogenicity.Portal vein is patent on color Doppler imaging with normal direction of blood flow towards the liver. IMPRESSION: Cholecystolithiasis. Additionally, there is a mildly thickened appearance of the gallbladder wall (measuring 4-5 mm). However, the gallbladder is somewhat underdistended potentially accounting for this finding. Mildly dilated common bile duct (6-7 mm in diameter). 3.5 x 3.9 x 4.9 cm hypoechoic mass with associated color Doppler flow in the left hepatic lobe. Background prominent hepatic steatosis. Given the left hepatic lobe mass, apparent severe hepatic steatosis and mild common bile duct dilation, an MRI without and with contrast and MRCP are recommended for further evaluation. This may be non-emergent, if clinically appropriate. Electronically Signed   By: Kellie Simmering D.O.   On: 08/25/2021 18:45    Anti-infectives: Anti-infectives (From admission, onward)    Start     Dose/Rate Route Frequency Ordered Stop   08/26/21 0200  piperacillin-tazobactam (ZOSYN) IVPB 3.375 g        3.375 g 12.5 mL/hr over 240 Minutes Intravenous Every 8 hours 08/26/21 0105     08/25/21 1915  piperacillin-tazobactam (ZOSYN) IVPB 3.375 g        3.375 g 100 mL/hr over 30 Minutes Intravenous  Once 08/25/21 1903 08/25/21 1948       Assessment/Plan: s/p Procedure(s): LAPAROSCOPIC CHOLECYSTECTOMY (N/A) Advance diet Discharge  LOS: 0 days    Autumn Messing III 08/27/2021

## 2021-08-29 ENCOUNTER — Encounter (HOSPITAL_COMMUNITY): Payer: Self-pay | Admitting: General Surgery

## 2021-08-29 LAB — SURGICAL PATHOLOGY

## 2021-08-29 NOTE — Anesthesia Postprocedure Evaluation (Signed)
Anesthesia Post Note  Patient: Amanda [name]  Procedure(s) Performed: LAPAROSCOPIC CHOLECYSTECTOMY (Abdomen)     Patient location during evaluation: PACU Anesthesia Type: General Level of consciousness: awake and alert Pain management: pain level controlled Vital Signs Assessment: post-procedure vital signs reviewed and stable Respiratory status: spontaneous breathing, nonlabored ventilation, respiratory function stable and patient connected to nasal cannula oxygen Cardiovascular status: blood pressure returned to baseline and stable Postop Assessment: no apparent nausea or vomiting Anesthetic complications: no   No notable events documented.  Last Vitals:  Vitals:   08/27/21 0312 08/27/21 0819  BP: 135/76 137/66  Pulse: 69 94  Resp: 16 (!) 25  Temp: 36.7 C   SpO2: 93% 93%    Last Pain:  Vitals:   08/27/21 0312  TempSrc:   PainSc: 3                  Candra R Lareen Mullings

## 2022-10-25 IMAGING — MR MR ABDOMEN WO/W CM
9 of 18 series · 23 of 48 positions shown · IV contrast (9 GAD)
Comparison: No prior abdominal MRI. Abdominal ultrasound
08/25/2021.

CLINICAL DATA: 26-year-old female history of indeterminate liver
lesion noted on prior abdominal ultrasound. Follow-up study.

EXAM:
MRI ABDOMEN WITHOUT AND WITH CONTRAST
TECHNIQUE: Multiplanar multisequence MR imaging of the abdomen was performed
both before and after the administration of intravenous contrast.
CONTRAST:  9mL GADAVIST GADOBUTROL 1 MMOL/ML IV SOLN

[Series 4: ax ssfse nav · axial · 6.0mm · 0.94mm/px · z∈[-132,+180]mm · 2 of 53 slices shown]
[im 1/53]
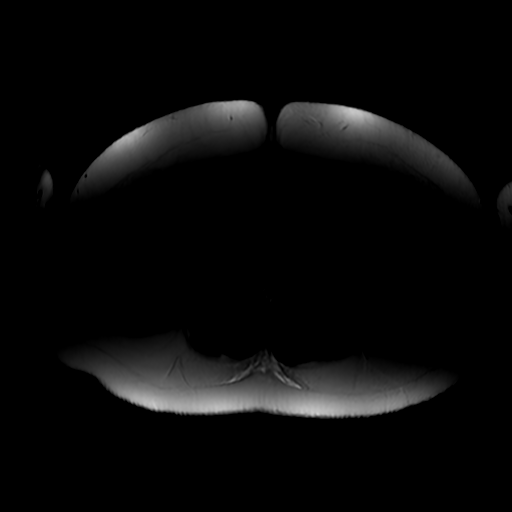
[im 53/53]
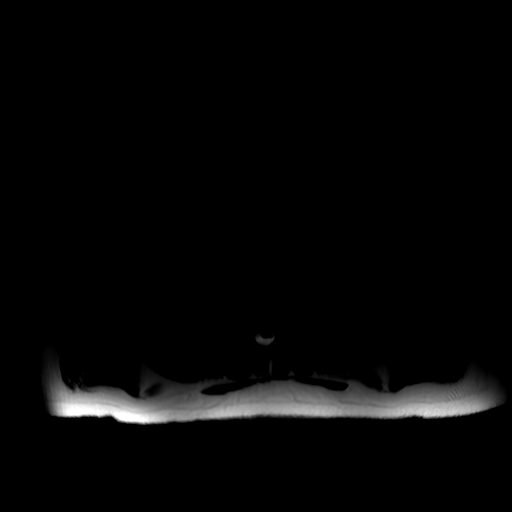

[Series 5: cor ssfse nav · coronal · 6.0mm · 0.90mm/px · 2 of 53 slices shown]
[im 1/53]
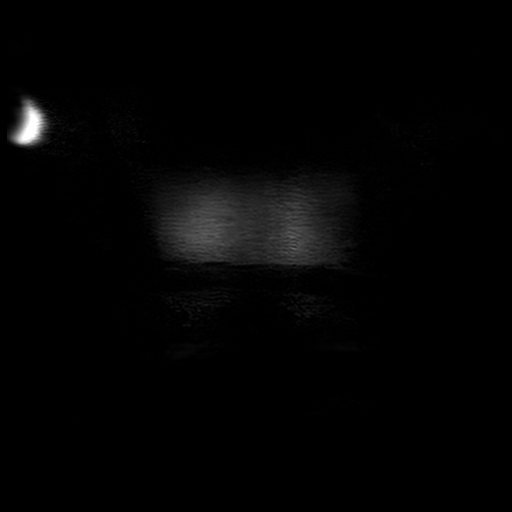
[im 53/53]
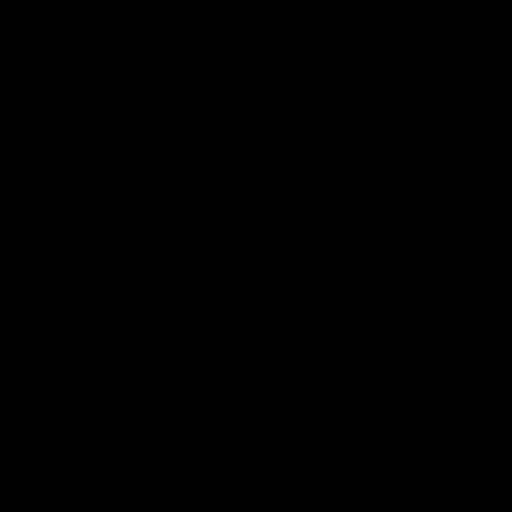

[Series 6: DWI b500 · axial · 8.0mm · 1.80mm/px · z∈[-160,+180]mm · 3 of 70 slices shown]
[im 1/70]
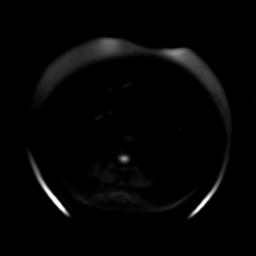
[im 35/70]
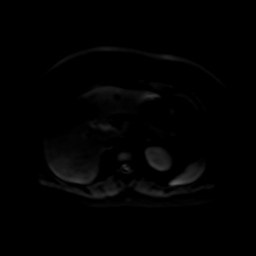
[im 70/70]
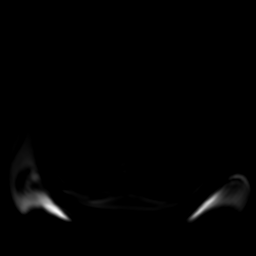

[Series 7: T2 fat-sat · axial · 6.0mm · 0.90mm/px · 1 of 26 slices shown]
[im 1/26]
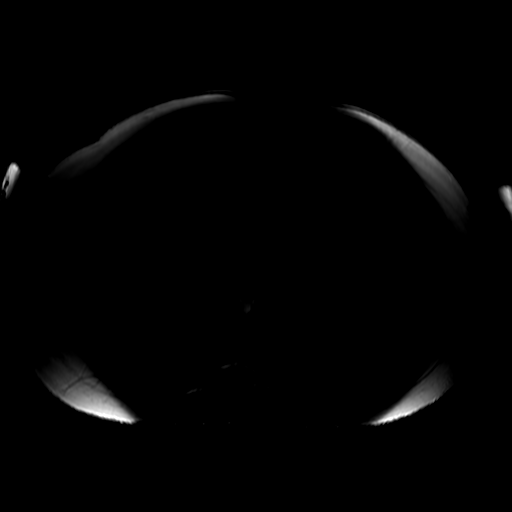

[Series 8: T1 dynamic · axial · 4.9mm · 0.90mm/px · z∈[-145,+183]mm · 3 of 132 slices shown (1 of 4)]
[im 1/132]
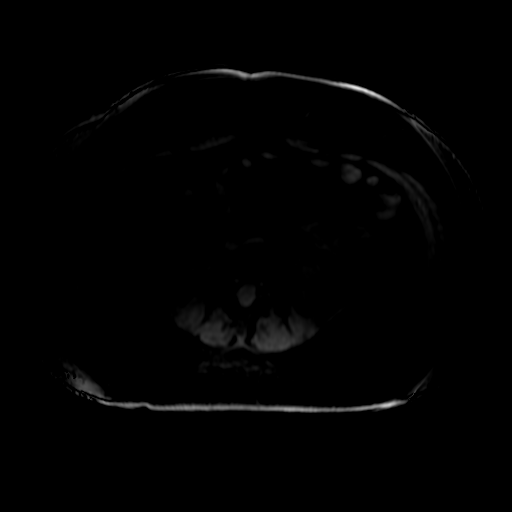
[im 66/132]
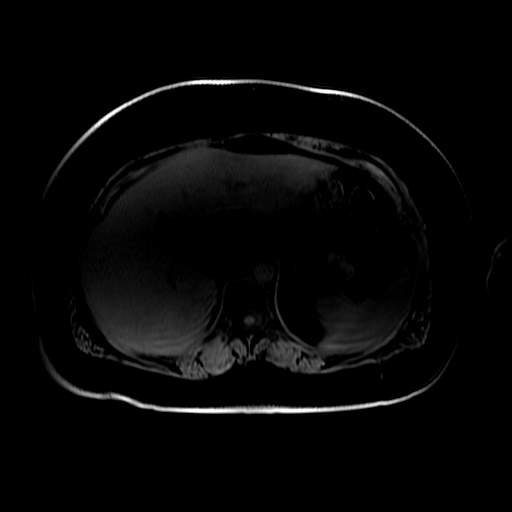
[im 132/132]
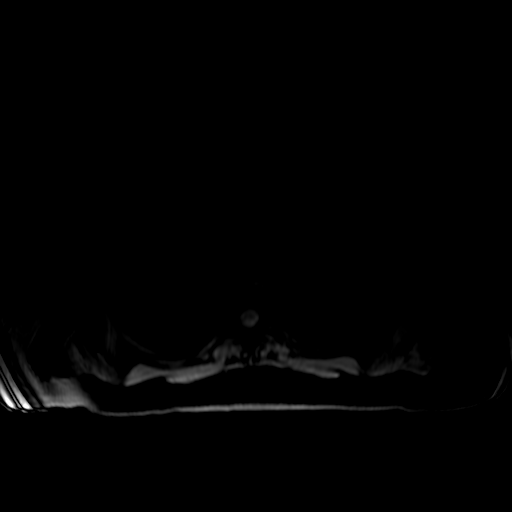

[Series 10: T1 dynamic · coronal · 3.4mm · 1.80mm/px · 5 of 180 slices shown (2 of 4)]
[im 1/180]
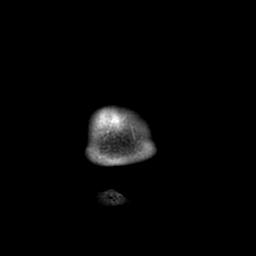
[im 45/180]
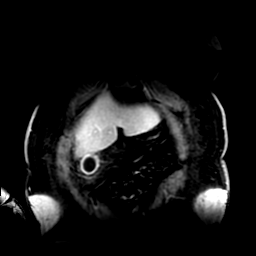
[im 90/180]
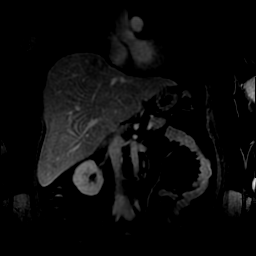
[im 135/180]
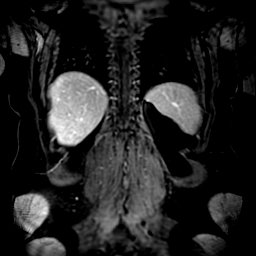
[im 180/180]
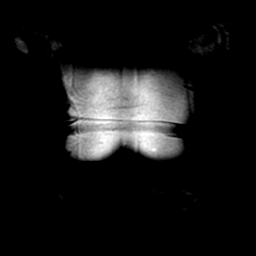

[Series 650: ADC · axial · 8.0mm · 1.80mm/px · 1 of 35 slices shown]
[im 1/35]
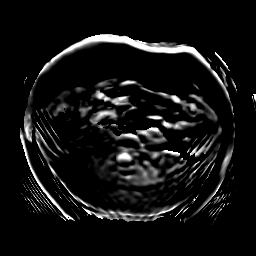

[Series 800: T1 dynamic · axial · 4.9mm · 0.90mm/px · z∈[-145,+183]mm · 3 of 132 slices shown (3 of 4)]
[im 1/132]
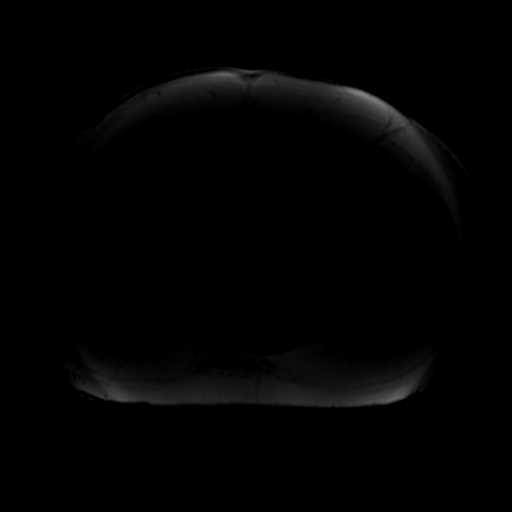
[im 66/132]
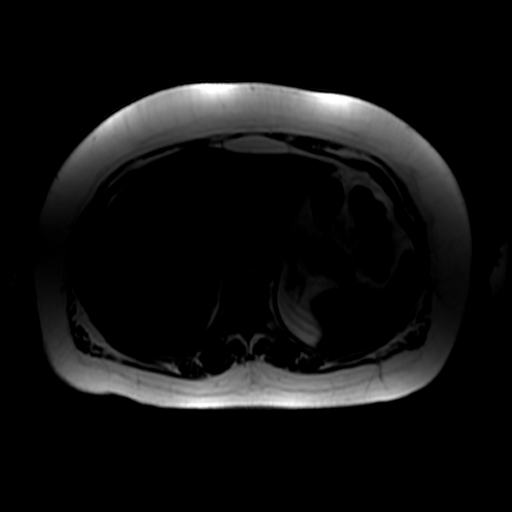
[im 132/132]
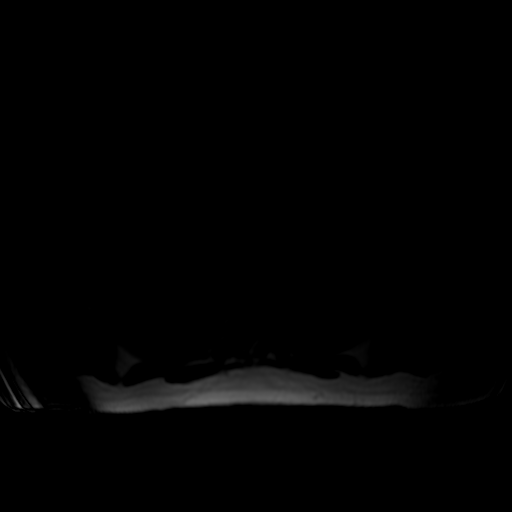

[Series 801: T1 dynamic · axial · 4.9mm · 0.90mm/px · z∈[-145,+183]mm · 3 of 132 slices shown (4 of 4)]
[im 1/132]
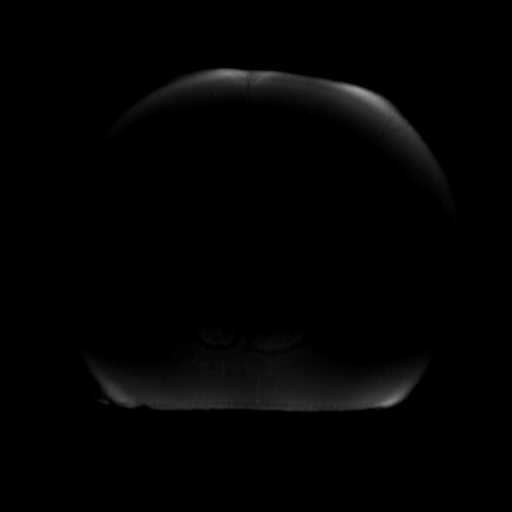
[im 66/132]
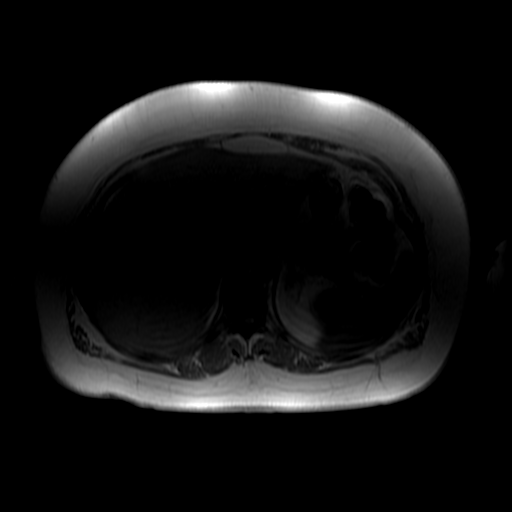
[im 132/132]
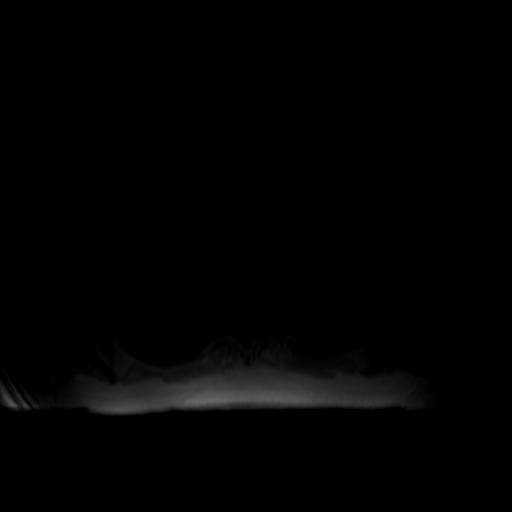

[23 of 48 positions shown; findings below may reference images not displayed]

FINDINGS: Lower chest: Unremarkable.

Hepatobiliary: Diffuse loss of signal intensity throughout the
hepatic parenchyma on out of phase dual echo images, indicative of a
background of hepatic steatosis. The lesion of concern in the left
lobe of the liver is centered predominantly in segment 2 (axial
image 50 of series 902 and coronal image 76 of series 10) estimated
to measure approximately 4.3 x 3.1 x 3.0 cm. This lesion is very
difficult to visualize on precontrast T1 and T2 weighted images,
nearly isointense to the liver on all pulse sequences. On post
gadolinium imaging the lesion initially appears predominantly
hypovascular with some very heterogeneous areas of internal
enhancement and enhancing nodularity, and progressively becomes more
occult on delayed imaging similar to background blood pool. No other
suspicious hepatic lesions are noted. No intra or extrahepatic
biliary ductal dilatation. Common bile duct measures 2 mm in the
porta hepatis. No filling defect within the common bile duct to
suggest choledocholithiasis. There is some amorphous material lying
dependently in the gallbladder which is mildly T2 hypointense,
likely to reflect biliary sludge. Gallbladder appears moderately
distended. Gallbladder wall appears thickened and edematous, with a
trace amount of surrounding T2 signal intensity which suggests acute
inflammation.

Pancreas: No pancreatic mass. No pancreatic ductal dilatation. No
pancreatic or peripancreatic fluid collections or inflammatory
changes.

Spleen:  Unremarkable.

Adrenals/Urinary Tract: Bilateral kidneys and adrenal glands are
normal in appearance. No hydroureteronephrosis in the visualized
portions of the abdomen.

Stomach/Bowel: Increased T2 signal intensity adjacent to the
duodenum, likely related to acute gallbladder inflammation.
Visualized portions are otherwise unremarkable.

Vascular/Lymphatic: No aneurysm identified in the visualized
abdominal vasculature. No lymphadenopathy noted in the abdomen.

Other: Trace amount of T2 signal intensity in the right upper
quadrant centered around the gallbladder and tracking caudally
adjacent to the second portion of the duodenum. No large volume of
ascites noted in the visualized portions of the peritoneal cavity.

Musculoskeletal: No aggressive appearing osseous lesions are noted
in the visualized portions of the skeleton.
IMPRESSION: 1. Biliary sludge lying dependently in the gallbladder with moderate
distension of the gallbladder, thickening and edema in the
gallbladder wall, and small amount of surrounding inflammation;
imaging findings indicative of acute cholecystitis.
2. No choledocholithiasis or findings of biliary tract obstruction.
3. Unusual lesion in the left lobe of the liver which has imaging
characteristics most suggestive of a slightly atypical cavernous
hemangioma. This is favored to represent a benign finding, however,
attention on repeat abdominal MRI with and without IV gadolinium
(preferably Eovist) is recommended in 3-6 months to re-evaluate this
lesion and ensure stability.
4. Hepatic steatosis.

These results will be called to the ordering clinician or
representative by the Radiologist Assistant, and communication
documented in the PACS or [REDACTED].

## 2022-11-17 DIAGNOSIS — H04121 Dry eye syndrome of right lacrimal gland: Secondary | ICD-10-CM | POA: Diagnosis not present

## 2022-12-18 DIAGNOSIS — E782 Mixed hyperlipidemia: Secondary | ICD-10-CM | POA: Diagnosis not present

## 2022-12-18 DIAGNOSIS — Z0000 Encounter for general adult medical examination without abnormal findings: Secondary | ICD-10-CM | POA: Diagnosis not present

## 2022-12-18 DIAGNOSIS — Z23 Encounter for immunization: Secondary | ICD-10-CM | POA: Diagnosis not present

## 2022-12-18 DIAGNOSIS — R7303 Prediabetes: Secondary | ICD-10-CM | POA: Diagnosis not present

## 2022-12-18 DIAGNOSIS — D509 Iron deficiency anemia, unspecified: Secondary | ICD-10-CM | POA: Diagnosis not present

## 2022-12-18 DIAGNOSIS — E119 Type 2 diabetes mellitus without complications: Secondary | ICD-10-CM | POA: Diagnosis not present

## 2023-03-15 ENCOUNTER — Emergency Department (HOSPITAL_BASED_OUTPATIENT_CLINIC_OR_DEPARTMENT_OTHER): Payer: BC Managed Care – PPO

## 2023-03-15 ENCOUNTER — Emergency Department (HOSPITAL_BASED_OUTPATIENT_CLINIC_OR_DEPARTMENT_OTHER)
Admission: EM | Admit: 2023-03-15 | Discharge: 2023-03-16 | Disposition: A | Discharge status: Home / Self Care | Payer: BC Managed Care – PPO

## 2023-03-15 ENCOUNTER — Other Ambulatory Visit: Payer: Self-pay

## 2023-03-15 ENCOUNTER — Encounter (HOSPITAL_BASED_OUTPATIENT_CLINIC_OR_DEPARTMENT_OTHER): Payer: Self-pay

## 2023-03-15 DIAGNOSIS — R319 Hematuria, unspecified: Secondary | ICD-10-CM | POA: Diagnosis not present

## 2023-03-15 DIAGNOSIS — R030 Elevated blood-pressure reading, without diagnosis of hypertension: Secondary | ICD-10-CM | POA: Diagnosis not present

## 2023-03-15 DIAGNOSIS — Z79899 Other long term (current) drug therapy: Secondary | ICD-10-CM | POA: Diagnosis not present

## 2023-03-15 DIAGNOSIS — R7989 Other specified abnormal findings of blood chemistry: Secondary | ICD-10-CM | POA: Diagnosis not present

## 2023-03-15 DIAGNOSIS — Z6839 Body mass index (BMI) 39.0-39.9, adult: Secondary | ICD-10-CM | POA: Diagnosis not present

## 2023-03-15 DIAGNOSIS — K8051 Calculus of bile duct without cholangitis or cholecystitis with obstruction: Secondary | ICD-10-CM | POA: Diagnosis not present

## 2023-03-15 DIAGNOSIS — K760 Fatty (change of) liver, not elsewhere classified: Secondary | ICD-10-CM | POA: Insufficient documentation

## 2023-03-15 DIAGNOSIS — R1084 Generalized abdominal pain: Secondary | ICD-10-CM | POA: Diagnosis not present

## 2023-03-15 DIAGNOSIS — R932 Abnormal findings on diagnostic imaging of liver and biliary tract: Secondary | ICD-10-CM | POA: Diagnosis not present

## 2023-03-15 DIAGNOSIS — R7303 Prediabetes: Secondary | ICD-10-CM | POA: Diagnosis not present

## 2023-03-15 DIAGNOSIS — D1803 Hemangioma of intra-abdominal structures: Secondary | ICD-10-CM | POA: Diagnosis not present

## 2023-03-15 DIAGNOSIS — R748 Abnormal levels of other serum enzymes: Secondary | ICD-10-CM | POA: Diagnosis not present

## 2023-03-15 DIAGNOSIS — Z833 Family history of diabetes mellitus: Secondary | ICD-10-CM | POA: Diagnosis not present

## 2023-03-15 DIAGNOSIS — K8050 Calculus of bile duct without cholangitis or cholecystitis without obstruction: Secondary | ICD-10-CM | POA: Diagnosis not present

## 2023-03-15 DIAGNOSIS — Z9049 Acquired absence of other specified parts of digestive tract: Secondary | ICD-10-CM | POA: Diagnosis not present

## 2023-03-15 DIAGNOSIS — R160 Hepatomegaly, not elsewhere classified: Secondary | ICD-10-CM | POA: Diagnosis not present

## 2023-03-15 DIAGNOSIS — K5900 Constipation, unspecified: Secondary | ICD-10-CM | POA: Diagnosis not present

## 2023-03-15 DIAGNOSIS — K769 Liver disease, unspecified: Secondary | ICD-10-CM | POA: Diagnosis not present

## 2023-03-15 DIAGNOSIS — R17 Unspecified jaundice: Secondary | ICD-10-CM | POA: Diagnosis not present

## 2023-03-15 DIAGNOSIS — R109 Unspecified abdominal pain: Secondary | ICD-10-CM | POA: Diagnosis not present

## 2023-03-15 DIAGNOSIS — F909 Attention-deficit hyperactivity disorder, unspecified type: Secondary | ICD-10-CM | POA: Diagnosis not present

## 2023-03-15 DIAGNOSIS — R7401 Elevation of levels of liver transaminase levels: Secondary | ICD-10-CM | POA: Diagnosis not present

## 2023-03-15 DIAGNOSIS — R1011 Right upper quadrant pain: Secondary | ICD-10-CM | POA: Diagnosis not present

## 2023-03-15 DIAGNOSIS — K838 Other specified diseases of biliary tract: Secondary | ICD-10-CM | POA: Diagnosis not present

## 2023-03-15 DIAGNOSIS — E66812 Obesity, class 2: Secondary | ICD-10-CM | POA: Diagnosis not present

## 2023-03-15 LAB — COMPREHENSIVE METABOLIC PANEL
ALT: 265 U/L — ABNORMAL HIGH (ref 0–44)
AST: 162 U/L — ABNORMAL HIGH (ref 15–41)
Albumin: 4.2 g/dL (ref 3.5–5.0)
Alkaline Phosphatase: 243 U/L — ABNORMAL HIGH (ref 38–126)
Anion gap: 12 (ref 5–15)
BUN: 9 mg/dL (ref 6–20)
CO2: 25 mmol/L (ref 22–32)
Calcium: 9.3 mg/dL (ref 8.9–10.3)
Chloride: 98 mmol/L (ref 98–111)
Creatinine, Ser: 0.58 mg/dL (ref 0.44–1.00)
GFR, Estimated: >60 mL/min (ref >60)
Glucose, Bld: 123 mg/dL — ABNORMAL HIGH (ref 70–99)
Potassium: 3.8 mmol/L (ref 3.5–5.1)
Sodium: 135 mmol/L (ref 135–145)
Total Bilirubin: 4.1 mg/dL — ABNORMAL HIGH (ref <1.2)
Total Protein: 7.7 g/dL (ref 6.5–8.1)

## 2023-03-15 LAB — URINALYSIS, ROUTINE W REFLEX MICROSCOPIC
Glucose, UA: NEGATIVE mg/dL
Hgb urine dipstick: NEGATIVE
Ketones, ur: NEGATIVE mg/dL
Nitrite: NEGATIVE
Specific Gravity, Urine: 1.023 (ref 1.005–1.030)
pH: 5.5 (ref 5.0–8.0)

## 2023-03-15 LAB — CBC
HCT: 39 % (ref 36.0–46.0)
Hemoglobin: 12.4 g/dL (ref 12.0–15.0)
MCH: 24.6 pg — ABNORMAL LOW (ref 26.0–34.0)
MCHC: 31.8 g/dL (ref 30.0–36.0)
MCV: 77.2 fL — ABNORMAL LOW (ref 80.0–100.0)
Platelets: 335 10*3/uL (ref 150–400)
RBC: 5.05 MIL/uL (ref 3.87–5.11)
RDW: 15.3 % (ref 11.5–15.5)
WBC: 13.4 10*3/uL — ABNORMAL HIGH (ref 4.0–10.5)
nRBC: 0 % (ref 0.0–0.2)

## 2023-03-15 LAB — PREGNANCY, URINE: Preg Test, Ur: NEGATIVE

## 2023-03-15 LAB — LIPASE, BLOOD: Lipase: <10 U/L — ABNORMAL LOW (ref 11–51)

## 2023-03-15 MED ORDER — MORPHINE SULFATE (PF) 2 MG/ML IV SOLN
2.0000 mg | Freq: Once | INTRAVENOUS | Status: AC
  Administered 2023-03-15: 2 mg via INTRAVENOUS
  Filled 2023-03-15: qty 1

## 2023-03-15 MED ORDER — IOHEXOL 300 MG/ML  SOLN
100.0000 mL | Freq: Once | INTRAMUSCULAR | Status: AC | PRN
Start: 2023-03-15 — End: 2023-03-15
  Administered 2023-03-15: 100 mL via INTRAVENOUS

## 2023-03-15 MED ORDER — ONDANSETRON HCL 4 MG/2ML IJ SOLN
4.0000 mg | Freq: Once | INTRAMUSCULAR | Status: AC
  Administered 2023-03-15: 4 mg via INTRAVENOUS
  Filled 2023-03-15: qty 2

## 2023-03-15 NOTE — ED Provider Notes (Incomplete)
[place] EMERGENCY DEPARTMENT AT [facility] Provider Note   CSN: [id number] Arrival date & time: 03/15/23  2119     History {Add pertinent medical, surgical, social history, OB history to HPI:1} Chief Complaint  Patient presents with  . Abdominal Pain    Amanda [name] is a 26 y.o. female.   Abdominal Pain      Home Medications Prior to Admission medications   Medication Sig Start Date End Date Taking? Authorizing Provider  ibuprofen (ADVIL) 200 MG tablet Take 600 mg by mouth every 6 (six) hours as needed.    [provider]  oxyCODONE (OXY IR/ROXICODONE) 5 MG immediate release tablet Take 1 tablet (5 mg total) by mouth every 6 (six) hours as needed for moderate pain. 08/27/21   Griselda Miner, MD  SPRINTEC 28 0.25-35 MG-MCG tablet Take 1 tablet by mouth daily. 06/20/21   [provider]      Allergies    Patient has no known allergies.    Review of Systems   Review of Systems  Gastrointestinal:  Positive for abdominal pain.    Physical Exam Updated Vital Signs BP (!) 152/84   Pulse 84   Temp 98.2 F (36.8 C)   Resp 18   Ht 5\' 6"  (1.676 m)   Wt 112 kg   LMP 02/21/2023   SpO2 99%   BMI 39.85 kg/m  Physical Exam  ED Results / Procedures / Treatments   Labs (all labs ordered are listed, but only abnormal results are displayed) Labs Reviewed  LIPASE, BLOOD - Abnormal; Notable for the following components:      Result Value   Lipase <10 (*)    All other components within normal limits  COMPREHENSIVE METABOLIC PANEL - Abnormal; Notable for the following components:   Glucose, Bld 123 (*)    AST 162 (*)    ALT 265 (*)    Alkaline Phosphatase 243 (*)    Total Bilirubin 4.1 (*)    All other components within normal limits  CBC - Abnormal; Notable for the following components:   WBC 13.4 (*)    MCV 77.2 (*)    MCH 24.6 (*)    All other components within normal limits  URINALYSIS, ROUTINE W REFLEX MICROSCOPIC - Abnormal;  Notable for the following components:   APPearance HAZY (*)    Bilirubin Urine MODERATE (*)    Protein, ur TRACE (*)    Leukocytes,Ua MODERATE (*)    Bacteria, UA RARE (*)    All other components within normal limits  PREGNANCY, URINE    EKG None  Radiology No results found.  Procedures Procedures  {Document cardiac monitor, telemetry assessment procedure when appropriate:1}  Medications Ordered in ED Medications  iohexol (OMNIPAQUE) 300 MG/ML solution 100 mL (100 mLs Intravenous Contrast Given 03/15/23 2241)  morphine (PF) 2 MG/ML injection 2 mg (2 mg Intravenous Given 03/15/23 2252)  ondansetron (ZOFRAN) injection 4 mg (4 mg Intravenous Given 03/15/23 2252)    ED Course/ Medical Decision Making/ A&P   {   Click here for ABCD2, HEART and other calculatorsREFRESH Note before signing :1}                              Medical Decision Making Amount and/or Complexity of Data Reviewed Labs: ordered. Radiology: ordered.  Risk Prescription drug management.   ***  {Document critical care time when appropriate:1} {Document review of labs and clinical decision  tools ie heart score, Chads2Vasc2 etc:1}  {Document your independent review of radiology images, and any outside records:1} {Document your discussion with family members, caretakers, and with consultants:1} {Document social determinants of health affecting pt's care:1} {Document your decision making why or why not admission, treatments were needed:1} Final Clinical Impression(s) / ED Diagnoses Final diagnoses:  None    Rx / DC Orders ED Discharge Orders     None

## 2023-03-15 NOTE — ED Triage Notes (Signed)
Abdo since Tuesday night. Generalized. Hx cholecystectomy

## 2023-03-15 NOTE — ED Provider Notes (Signed)
[place] EMERGENCY DEPARTMENT AT [facility] Provider Note   CSN: [id number] Arrival date & time: 03/15/23  2119     History  Chief Complaint  Patient presents with   Abdominal Pain    Amanda [name] is a 27 y.o. female with a history of hepatic steatosis and obesity who presents the ED today for abdominal pain.  Patient reports generalized abdominal pain for the past 4 days that radiates to her back.  She does not recall any aggravating incident before the onset of pain.  Denies associated fever, nausea, vomiting, or diarrhea.  Patient has not tried any OTC analgesics to help with her symptoms.  She was evaluated urgent care yesterday and they told her that she was constipated and to take MiraLAX.  She states that she had 3 watery bowel movements today but no improvement of her symptoms. She tried to make her self throw up earlier today to see if that would help relieve the abdominal pain, but it did not.  Endorses history of cholecystectomy about 2 years ago but denies any other abdominal surgeries in the past. No additional complaints or concerns at this time.    Home Medications Prior to Admission medications   Medication Sig Start Date End Date Taking? Authorizing Provider  dicyclomine (BENTYL) 20 MG tablet Take 1 tablet (20 mg total) by mouth 2 (two) times daily. 03/16/23  Yes Maxwell Marion, PA-C  ibuprofen (ADVIL) 200 MG tablet Take 600 mg by mouth every 6 (six) hours as needed.    [provider]  oxyCODONE (OXY IR/ROXICODONE) 5 MG immediate release tablet Take 1 tablet (5 mg total) by mouth every 6 (six) hours as needed for moderate pain. 08/27/21   Griselda Miner, MD  SPRINTEC 28 0.25-35 MG-MCG tablet Take 1 tablet by mouth daily. 06/20/21   [provider]      Allergies    Patient has no known allergies.    Review of Systems   Review of Systems  Gastrointestinal:  Positive for abdominal pain.  All other systems reviewed and are  negative.   Physical Exam Updated Vital Signs BP 138/84   Pulse 85   Temp 98.2 F (36.8 C)   Resp 18   Ht 5\' 6"  (1.676 m)   Wt 112 kg   LMP 02/21/2023   SpO2 97%   BMI 39.85 kg/m  Physical Exam Vitals and nursing note reviewed.  Constitutional:      General: She is not in acute distress.    Appearance: Normal appearance.  HENT:     Head: Normocephalic and atraumatic.     Mouth/Throat:     Mouth: Mucous membranes are moist.  Eyes:     General: No scleral icterus.    Conjunctiva/sclera: Conjunctivae normal.     Pupils: Pupils are equal, round, and reactive to light.  Cardiovascular:     Rate and Rhythm: Normal rate and regular rhythm.     Pulses: Normal pulses.     Heart sounds: Normal heart sounds.  Pulmonary:     Effort: Pulmonary effort is normal.     Breath sounds: Normal breath sounds.  Abdominal:     General: There is no distension.     Palpations: Abdomen is soft.     Tenderness: There is abdominal tenderness. There is no right CVA tenderness, left CVA tenderness, guarding or rebound.     Comments: Tenderness to palpation of the LUQ, epigastrium, and RUQ without rebound  Musculoskeletal:  Cervical back: Normal range of motion.  Skin:    General: Skin is warm and dry.     Findings: No rash.  Neurological:     General: No focal deficit present.     Mental Status: She is alert.  Psychiatric:        Mood and Affect: Mood normal.        Behavior: Behavior normal.    ED Results / Procedures / Treatments   Labs (all labs ordered are listed, but only abnormal results are displayed) Labs Reviewed  LIPASE, BLOOD - Abnormal; Notable for the following components:      Result Value   Lipase <10 (*)    All other components within normal limits  COMPREHENSIVE METABOLIC PANEL - Abnormal; Notable for the following components:   Glucose, Bld 123 (*)    AST 162 (*)    ALT 265 (*)    Alkaline Phosphatase 243 (*)    Total Bilirubin 4.1 (*)    All other  components within normal limits  CBC - Abnormal; Notable for the following components:   WBC 13.4 (*)    MCV 77.2 (*)    MCH 24.6 (*)    All other components within normal limits  URINALYSIS, ROUTINE W REFLEX MICROSCOPIC - Abnormal; Notable for the following components:   APPearance HAZY (*)    Bilirubin Urine MODERATE (*)    Protein, ur TRACE (*)    Leukocytes,Ua MODERATE (*)    Bacteria, UA RARE (*)    All other components within normal limits  URINE CULTURE  PREGNANCY, URINE    EKG None  Radiology CT ABDOMEN PELVIS W CONTRAST Result Date: 03/15/2023 CLINICAL DATA:  Acute abdominal pain EXAM: CT ABDOMEN AND PELVIS WITH CONTRAST TECHNIQUE: Multidetector CT imaging of the abdomen and pelvis was performed using the standard protocol following bolus administration of intravenous contrast. RADIATION DOSE REDUCTION: This exam was performed according to the departmental dose-optimization program which includes automated exposure control, adjustment of the mA and/or kV according to patient size and/or use of iterative reconstruction technique. CONTRAST:  OMNIPAQUE IOHEXOL 300 MG/ML  SOLN COMPARISON:  MRI abdomen 08/26/2021 FINDINGS: Lower chest: No acute abnormality. Hepatobiliary: The liver is enlarged. There is fatty infiltration of the liver. Subtle heterogeneous lesion in the left lobe of the liver measures 4.4 by 3.1 cm similar to prior MRI. No definite new liver lesions are identified. Patient is status post cholecystectomy. There is no biliary ductal dilatation. Pancreas: Unremarkable. No pancreatic ductal dilatation or surrounding inflammatory changes. Spleen: Normal in size without focal abnormality. Adrenals/Urinary Tract: Adrenal glands are unremarkable. Kidneys are normal, without renal calculi, focal lesion, or hydronephrosis. Bladder is unremarkable. Stomach/Bowel: Stomach is within normal limits. Appendix appears normal. No evidence of bowel wall thickening, distention, or  inflammatory changes. Vascular/Lymphatic: No significant vascular findings are present. No enlarged abdominal or pelvic lymph nodes. Reproductive: Uterus and bilateral adnexa are unremarkable. Other: No abdominal wall hernia or abnormality. No abdominopelvic ascites. Musculoskeletal: There are bilateral pars interarticularis defects at L5. There is 6 mm of anterolisthesis at L5-S1. IMPRESSION: 1. No acute localizing process in the abdomen or pelvis. 2. Hepatomegaly with fatty infiltration of the liver. 3. Stable heterogeneous lesion in the left lobe of the liver. Please see dedicated MRI abdomen 08/26/2021 for further description. 4. 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 Electronically Signed   By: Darliss Cheney M.D.   On: 03/15/2023 23:58    Procedures Procedures:  not indicated.   Medications Ordered in ED Medications  iohexol (OMNIPAQUE) 300 MG/ML solution 100 mL (100 mLs Intravenous Contrast Given 03/15/23 2241)  morphine (PF) 2 MG/ML injection 2 mg (2 mg Intravenous Given 03/15/23 2252)  ondansetron (ZOFRAN) injection 4 mg (4 mg Intravenous Given 03/15/23 2252)  dicyclomine (BENTYL) capsule 10 mg (10 mg Oral Given 03/16/23 0037)    ED Course/ Medical Decision Making/ A&P                                 Medical Decision Making Amount and/or Complexity of Data Reviewed Labs: ordered. Radiology: ordered.  Risk Prescription drug management.   This patient presents to the ED for concern of abdominal pain, this involves an extensive number of treatment options, and is a complaint that carries with it a high risk of complications and morbidity.   Differential diagnosis includes: gastroenteritis, gastritis, GERD, IBS, IBD, pancreatitis, hepatitis, transaminitis, bowel obstruction, perforation, volvulus, etc.   Comorbidities  See HPI above   Additional History  Additional history obtained from prior records.   Lab Tests  I ordered  and personally interpreted labs.  The pertinent results include:   AST of 162, ALT of 265, Alk phos of 243, total bilirubin of 4.1 Contaminated urine specimen. Sent for culture. Elevated WBC of 13.1 Negative pregnancy test   Imaging Studies  I ordered imaging studies including CT abdomen/pelvis  I independently visualized and interpreted imaging which showed:  1. No acute localizing process in the abdomen or pelvis. 2. Hepatomegaly with fatty infiltration of the liver. 3. Stable heterogeneous lesion in the left lobe of the liver. Please see dedicated MRI abdomen 08/26/2021 for further description. 4. 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 I agree with the radiologist interpretation   Problem List / ED Course / Critical Interventions / Medication Management  Abdominal pain x4 days I ordered medications including: Morphine for pain Zofran for nausea Reevaluation of the patient after these medicines showed that the patient improved. I have reviewed the patients home medicines and have made adjustments as needed. Discussed patient case with the night time attending, Dr. Rodena Medin, who agreed patient was safe for discharge home with outpatient GI follow up.   Social Determinants of Health  Access to healthcare   Test / Admission - Considered  Discussed findings with patient and family at bedside. She is hemodynamically stable and safe for discharge home. Ambulatory referral for gastroenterology sent. Prescription for Bentyl sent to the pharmacy. Return precautions given.       Final Clinical Impression(s) / ED Diagnoses Final diagnoses:  Hepatic steatosis    Rx / DC Orders ED Discharge Orders          Ordered    dicyclomine (BENTYL) 20 MG tablet  2 times daily        03/16/23 0036    Ambulatory referral to Gastroenterology       Comments: Hepatic steatosis   03/16/23 0036              Maxwell Marion,  PA-C 03/16/23 0040    Coral Spikes, DO 03/16/23 1455

## 2023-03-16 ENCOUNTER — Encounter (HOSPITAL_BASED_OUTPATIENT_CLINIC_OR_DEPARTMENT_OTHER): Payer: Self-pay

## 2023-03-16 ENCOUNTER — Emergency Department (HOSPITAL_BASED_OUTPATIENT_CLINIC_OR_DEPARTMENT_OTHER)
Admission: EM | Admit: 2023-03-16 | Discharge: 2023-03-17 | Disposition: A | Discharge status: Home / Self Care | Payer: BC Managed Care – PPO | Source: Home / Self Care | Attending: Emergency Medicine | Admitting: Emergency Medicine

## 2023-03-16 DIAGNOSIS — R1010 Upper abdominal pain, unspecified: Secondary | ICD-10-CM | POA: Insufficient documentation

## 2023-03-16 DIAGNOSIS — R17 Unspecified jaundice: Secondary | ICD-10-CM | POA: Insufficient documentation

## 2023-03-16 DIAGNOSIS — R7401 Elevation of levels of liver transaminase levels: Secondary | ICD-10-CM | POA: Insufficient documentation

## 2023-03-16 DIAGNOSIS — R7989 Other specified abnormal findings of blood chemistry: Secondary | ICD-10-CM

## 2023-03-16 LAB — COMPREHENSIVE METABOLIC PANEL
ALT: 281 U/L — ABNORMAL HIGH (ref 0–44)
AST: 132 U/L — ABNORMAL HIGH (ref 15–41)
Albumin: 4.2 g/dL (ref 3.5–5.0)
Alkaline Phosphatase: 275 U/L — ABNORMAL HIGH (ref 38–126)
Anion gap: 13 (ref 5–15)
BUN: 8 mg/dL (ref 6–20)
CO2: 25 mmol/L (ref 22–32)
Calcium: 9.5 mg/dL (ref 8.9–10.3)
Chloride: 97 mmol/L — ABNORMAL LOW (ref 98–111)
Creatinine, Ser: 0.56 mg/dL (ref 0.44–1.00)
GFR, Estimated: >60 mL/min (ref >60)
Glucose, Bld: 122 mg/dL — ABNORMAL HIGH (ref 70–99)
Potassium: 3.9 mmol/L (ref 3.5–5.1)
Sodium: 135 mmol/L (ref 135–145)
Total Bilirubin: 5.7 mg/dL — ABNORMAL HIGH (ref <1.2)
Total Protein: 8 g/dL (ref 6.5–8.1)

## 2023-03-16 LAB — CBC
HCT: 39.6 % (ref 36.0–46.0)
Hemoglobin: 12.6 g/dL (ref 12.0–15.0)
MCH: 24.7 pg — ABNORMAL LOW (ref 26.0–34.0)
MCHC: 31.8 g/dL (ref 30.0–36.0)
MCV: 77.6 fL — ABNORMAL LOW (ref 80.0–100.0)
Platelets: 347 10*3/uL (ref 150–400)
RBC: 5.1 MIL/uL (ref 3.87–5.11)
RDW: 15.5 % (ref 11.5–15.5)
WBC: 12.6 10*3/uL — ABNORMAL HIGH (ref 4.0–10.5)
nRBC: 0 % (ref 0.0–0.2)

## 2023-03-16 LAB — LIPASE, BLOOD: Lipase: <10 U/L — ABNORMAL LOW (ref 11–51)

## 2023-03-16 MED ORDER — DICYCLOMINE HCL 10 MG PO CAPS
10.0000 mg | ORAL_CAPSULE | Freq: Once | ORAL | Status: AC
  Administered 2023-03-16: 10 mg via ORAL
  Filled 2023-03-16: qty 1

## 2023-03-16 MED ORDER — DICYCLOMINE HCL 20 MG PO TABS: 20.0000 mg | ORAL_TABLET | Freq: Two times a day (BID) | ORAL | 0 refills | Status: DC

## 2023-03-16 NOTE — ED Triage Notes (Signed)
Pt c/o abd pain, continued from yesterday. Seen for same, dx w hepatic stenosis. States she has not had any improvement in symptoms

## 2023-03-16 NOTE — Discharge Instructions (Addendum)
As discussed your imaging is reassuring.  I have sent a referral for gastroenterology.  They will give you a call next week to schedule an appointment.  Additionally, I have sent a prescription of Bentyl to your pharmacy.  You can take this up to twice a day as needed for abdominal pain.  Get help right away if: You have jaundice. You have nausea and are vomiting. You vomit blood or material that looks like coffee grounds. You have stools that are black, tar-like, or bloody.

## 2023-03-17 ENCOUNTER — Inpatient Hospital Stay (HOSPITAL_COMMUNITY)
Admission: EM | Admit: 2023-03-17 | Discharge: 2023-03-20 | DRG: 886 | Disposition: A | Discharge status: Home / Self Care | Payer: BC Managed Care – PPO | Attending: Internal Medicine | Admitting: Internal Medicine

## 2023-03-17 ENCOUNTER — Other Ambulatory Visit: Payer: Self-pay

## 2023-03-17 ENCOUNTER — Ambulatory Visit (HOSPITAL_BASED_OUTPATIENT_CLINIC_OR_DEPARTMENT_OTHER)
Admission: RE | Admit: 2023-03-17 | Discharge: 2023-03-17 | Disposition: A | Discharge status: Home / Self Care | Payer: BC Managed Care – PPO | Source: Ambulatory Visit | Attending: Emergency Medicine | Admitting: Emergency Medicine

## 2023-03-17 ENCOUNTER — Encounter (HOSPITAL_COMMUNITY): Payer: Self-pay | Admitting: Internal Medicine

## 2023-03-17 DIAGNOSIS — R933 Abnormal findings on diagnostic imaging of other parts of digestive tract: Secondary | ICD-10-CM

## 2023-03-17 DIAGNOSIS — K8050 Calculus of bile duct without cholangitis or cholecystitis without obstruction: Secondary | ICD-10-CM | POA: Diagnosis present

## 2023-03-17 DIAGNOSIS — Z833 Family history of diabetes mellitus: Secondary | ICD-10-CM

## 2023-03-17 DIAGNOSIS — R030 Elevated blood-pressure reading, without diagnosis of hypertension: Secondary | ICD-10-CM | POA: Diagnosis present

## 2023-03-17 DIAGNOSIS — R7989 Other specified abnormal findings of blood chemistry: Secondary | ICD-10-CM | POA: Diagnosis not present

## 2023-03-17 DIAGNOSIS — D1803 Hemangioma of intra-abdominal structures: Secondary | ICD-10-CM | POA: Diagnosis present

## 2023-03-17 DIAGNOSIS — Z6839 Body mass index (BMI) 39.0-39.9, adult: Secondary | ICD-10-CM

## 2023-03-17 DIAGNOSIS — R17 Unspecified jaundice: Secondary | ICD-10-CM | POA: Diagnosis not present

## 2023-03-17 DIAGNOSIS — Z9049 Acquired absence of other specified parts of digestive tract: Secondary | ICD-10-CM

## 2023-03-17 DIAGNOSIS — R1010 Upper abdominal pain, unspecified: Secondary | ICD-10-CM | POA: Insufficient documentation

## 2023-03-17 DIAGNOSIS — R7401 Elevation of levels of liver transaminase levels: Secondary | ICD-10-CM | POA: Diagnosis present

## 2023-03-17 DIAGNOSIS — R7303 Prediabetes: Secondary | ICD-10-CM | POA: Diagnosis present

## 2023-03-17 DIAGNOSIS — K769 Liver disease, unspecified: Secondary | ICD-10-CM | POA: Diagnosis not present

## 2023-03-17 DIAGNOSIS — R748 Abnormal levels of other serum enzymes: Secondary | ICD-10-CM | POA: Insufficient documentation

## 2023-03-17 DIAGNOSIS — R1011 Right upper quadrant pain: Principal | ICD-10-CM

## 2023-03-17 DIAGNOSIS — K760 Fatty (change of) liver, not elsewhere classified: Secondary | ICD-10-CM | POA: Diagnosis present

## 2023-03-17 DIAGNOSIS — F909 Attention-deficit hyperactivity disorder, unspecified type: Principal | ICD-10-CM | POA: Diagnosis present

## 2023-03-17 DIAGNOSIS — Z79899 Other long term (current) drug therapy: Secondary | ICD-10-CM

## 2023-03-17 DIAGNOSIS — E66812 Obesity, class 2: Secondary | ICD-10-CM | POA: Diagnosis present

## 2023-03-17 DIAGNOSIS — K5900 Constipation, unspecified: Secondary | ICD-10-CM | POA: Diagnosis present

## 2023-03-17 LAB — BASIC METABOLIC PANEL
Anion gap: 11 (ref 5–15)
BUN: 10 mg/dL (ref 6–20)
CO2: 27 mmol/L (ref 22–32)
Calcium: 9.3 mg/dL (ref 8.9–10.3)
Chloride: 98 mmol/L (ref 98–111)
Creatinine, Ser: 0.52 mg/dL (ref 0.44–1.00)
GFR, Estimated: >60 mL/min (ref >60)
Glucose, Bld: 172 mg/dL — ABNORMAL HIGH (ref 70–99)
Potassium: 4.6 mmol/L (ref 3.5–5.1)
Sodium: 136 mmol/L (ref 135–145)

## 2023-03-17 LAB — URINALYSIS, ROUTINE W REFLEX MICROSCOPIC
Glucose, UA: NEGATIVE mg/dL
Ketones, ur: 40 mg/dL — AB
Nitrite: NEGATIVE
Protein, ur: 30 mg/dL — AB
Specific Gravity, Urine: 1.028 (ref 1.005–1.030)
pH: 5.5 (ref 5.0–8.0)

## 2023-03-17 LAB — CBC
HCT: 39.3 % (ref 36.0–46.0)
Hemoglobin: 12.4 g/dL (ref 12.0–15.0)
MCH: 24.8 pg — ABNORMAL LOW (ref 26.0–34.0)
MCHC: 31.6 g/dL (ref 30.0–36.0)
MCV: 78.8 fL — ABNORMAL LOW (ref 80.0–100.0)
Platelets: 337 10*3/uL (ref 150–400)
RBC: 4.99 MIL/uL (ref 3.87–5.11)
RDW: 15.6 % — ABNORMAL HIGH (ref 11.5–15.5)
WBC: 12 10*3/uL — ABNORMAL HIGH (ref 4.0–10.5)
nRBC: 0 % (ref 0.0–0.2)

## 2023-03-17 LAB — URINE CULTURE: Culture: <10000 — AB

## 2023-03-17 LAB — HEPATITIS PANEL, ACUTE
HCV Ab: NONREACTIVE
Hep A IgM: NONREACTIVE
Hep B C IgM: NONREACTIVE
Hepatitis B Surface Ag: NONREACTIVE

## 2023-03-17 LAB — HEPATIC FUNCTION PANEL
ALT: 272 U/L — ABNORMAL HIGH (ref 0–44)
AST: 138 U/L — ABNORMAL HIGH (ref 15–41)
Albumin: 3.5 g/dL (ref 3.5–5.0)
Alkaline Phosphatase: 260 U/L — ABNORMAL HIGH (ref 38–126)
Bilirubin, Direct: 4 mg/dL — ABNORMAL HIGH (ref 0.0–0.2)
Indirect Bilirubin: 2.2 mg/dL — ABNORMAL HIGH (ref 0.3–0.9)
Total Bilirubin: 6.2 mg/dL — ABNORMAL HIGH (ref <1.2)
Total Protein: 7.8 g/dL (ref 6.5–8.1)

## 2023-03-17 LAB — PREGNANCY, URINE: Preg Test, Ur: NEGATIVE

## 2023-03-17 MED ORDER — ONDANSETRON HCL 4 MG PO TABS: 4.0000 mg | ORAL_TABLET | Freq: Four times a day (QID) | ORAL | Status: DC | PRN

## 2023-03-17 MED ORDER — TRAZODONE HCL 50 MG PO TABS
25.0000 mg | ORAL_TABLET | Freq: Every evening | ORAL | Status: DC | PRN
Start: 2023-03-17 — End: 2023-03-21

## 2023-03-17 MED ORDER — OXYCODONE HCL 5 MG PO TABS
5.0000 mg | ORAL_TABLET | ORAL | Status: DC | PRN
  Administered 2023-03-18 (×2): 5 mg via ORAL
  Filled 2023-03-17 (×2): qty 1

## 2023-03-17 MED ORDER — MORPHINE SULFATE (PF) 4 MG/ML IV SOLN
4.0000 mg | Freq: Once | INTRAVENOUS | Status: AC
  Administered 2023-03-17: 4 mg via INTRAVENOUS
  Filled 2023-03-17: qty 1

## 2023-03-17 MED ORDER — ONDANSETRON HCL 4 MG/2ML IJ SOLN: 4.0000 mg | Freq: Four times a day (QID) | INTRAMUSCULAR | Status: DC | PRN

## 2023-03-17 MED ORDER — LORAZEPAM 2 MG/ML IJ SOLN
0.5000 mg | Freq: Once | INTRAMUSCULAR | Status: AC
  Administered 2023-03-18: 0.5 mg via INTRAVENOUS
  Filled 2023-03-17: qty 1

## 2023-03-17 MED ORDER — HYDROMORPHONE HCL 1 MG/ML IJ SOLN
2.0000 mg | Freq: Once | INTRAMUSCULAR | Status: AC
  Administered 2023-03-17: 2 mg via INTRAMUSCULAR
  Filled 2023-03-17: qty 2

## 2023-03-17 MED ORDER — ONDANSETRON 4 MG PO TBDP
8.0000 mg | ORAL_TABLET | Freq: Once | ORAL | Status: AC
  Administered 2023-03-17: 8 mg via ORAL
  Filled 2023-03-17: qty 2

## 2023-03-17 MED ORDER — ENOXAPARIN SODIUM 40 MG/0.4ML IJ SOSY
40.0000 mg | PREFILLED_SYRINGE | INTRAMUSCULAR | Status: DC
  Administered 2023-03-17: 40 mg via SUBCUTANEOUS
  Filled 2023-03-17: qty 0.4

## 2023-03-17 MED ORDER — IBUPROFEN 400 MG PO TABS
400.0000 mg | ORAL_TABLET | Freq: Four times a day (QID) | ORAL | Status: DC | PRN
  Administered 2023-03-18: 400 mg via ORAL
  Filled 2023-03-17: qty 1

## 2023-03-17 NOTE — ED Provider Notes (Signed)
[place] EMERGENCY DEPARTMENT AT [facility] Provider Note   CSN: [id number] Arrival date & time: 03/16/23  2127     History  Chief complaint: Abdominal pain  Amanda [name] is a 27 y.o. female.  The history is provided by the patient.  She has history of attention deficit disorder, prediabetes, hepatic steatosis and complains of generalized abdominal pain for the last 3 days.  Pain does radiate to the back and somewhat up into the chest.  She denies nausea or vomiting but did try to force herself to vomit yesterday but did not give her any relief.  She had gone to urgent care where she was told she was constipated and told to take MiraLAX.  She did have several bowel movements with no change in pain.  She was in emergency yesterday and had lab work and CT scan and was diagnosed with hepatic steatosis and referred to gastroenterology.  She continues to have abdominal pain.  Pain is worse if she eats anything but not if she drinks.  She denies fever or chills.  Of note, she is approximately 18 months status post cholecystectomy.   Home Medications Prior to Admission medications   Medication Sig Start Date End Date Taking? Authorizing Provider  dicyclomine (BENTYL) 20 MG tablet Take 1 tablet (20 mg total) by mouth 2 (two) times daily. 03/16/23   Maxwell Marion, PA-C  ibuprofen (ADVIL) 200 MG tablet Take 600 mg by mouth every 6 (six) hours as needed.    [provider]  oxyCODONE (OXY IR/ROXICODONE) 5 MG immediate release tablet Take 1 tablet (5 mg total) by mouth every 6 (six) hours as needed for moderate pain. 08/27/21   Griselda Miner, MD  SPRINTEC 28 0.25-35 MG-MCG tablet Take 1 tablet by mouth daily. 06/20/21   [provider]      Allergies    Patient has no known allergies.    Review of Systems   Review of Systems  All other systems reviewed and are negative.   Physical Exam Updated Vital Signs BP (!) 151/109 (BP Location: Left Arm)   Pulse 72    Temp 98.9 F (37.2 C)   Resp 20   LMP 02/21/2023   SpO2 98%  Physical Exam Vitals and nursing note reviewed.   27 year old female, resting comfortably and in no acute distress. Vital signs are significant for elevated blood pressure. Oxygen saturation is 98%, which is normal. Head is normocephalic and atraumatic. PERRLA, EOMI. Oropharynx is clear.  There is mild scleral icterus. Neck is nontender and supple without adenopathy. Back is nontender and there is no CVA tenderness. Lungs are clear without rales, wheezes, or rhonchi. Chest is nontender. Heart has regular rate and rhythm without murmur. Abdomen is soft, flat, with moderate to severe tenderness across the upper abdomen.  There is no rebound or guarding.  Peristalsis is normoactive. Extremities have no cyanosis or edema, full range of motion is present. Skin is warm and dry without rash. Neurologic: Mental status is normal, moves all extremities equally.  ED Results / Procedures / Treatments   Labs (all labs ordered are listed, but only abnormal results are displayed) Labs Reviewed  LIPASE, BLOOD - Abnormal; Notable for the following components:      Result Value   Lipase <10 (*)    All other components within normal limits  COMPREHENSIVE METABOLIC PANEL - Abnormal; Notable for the following components:   Chloride 97 (*)    Glucose, Bld 122 (*)  AST 132 (*)    ALT 281 (*)    Alkaline Phosphatase 275 (*)    Total Bilirubin 5.7 (*)    All other components within normal limits  CBC - Abnormal; Notable for the following components:   WBC 12.6 (*)    MCV 77.6 (*)    MCH 24.7 (*)    All other components within normal limits  URINALYSIS, ROUTINE W REFLEX MICROSCOPIC  PREGNANCY, URINE   Radiology CT ABDOMEN PELVIS W CONTRAST Result Date: 03/15/2023 CLINICAL DATA:  Acute abdominal pain EXAM: CT ABDOMEN AND PELVIS WITH CONTRAST TECHNIQUE: Multidetector CT imaging of the abdomen and pelvis was performed using the standard  protocol following bolus administration of intravenous contrast. RADIATION DOSE REDUCTION: This exam was performed according to the departmental dose-optimization program which includes automated exposure control, adjustment of the mA and/or kV according to patient size and/or use of iterative reconstruction technique. CONTRAST:  OMNIPAQUE IOHEXOL 300 MG/ML  SOLN COMPARISON:  MRI abdomen 08/26/2021 FINDINGS: Lower chest: No acute abnormality. Hepatobiliary: The liver is enlarged. There is fatty infiltration of the liver. Subtle heterogeneous lesion in the left lobe of the liver measures 4.4 by 3.1 cm similar to prior MRI. No definite new liver lesions are identified. Patient is status post cholecystectomy. There is no biliary ductal dilatation. Pancreas: Unremarkable. No pancreatic ductal dilatation or surrounding inflammatory changes. Spleen: Normal in size without focal abnormality. Adrenals/Urinary Tract: Adrenal glands are unremarkable. Kidneys are normal, without renal calculi, focal lesion, or hydronephrosis. Bladder is unremarkable. Stomach/Bowel: Stomach is within normal limits. Appendix appears normal. No evidence of bowel wall thickening, distention, or inflammatory changes. Vascular/Lymphatic: No significant vascular findings are present. No enlarged abdominal or pelvic lymph nodes. Reproductive: Uterus and bilateral adnexa are unremarkable. Other: No abdominal wall hernia or abnormality. No abdominopelvic ascites. Musculoskeletal: There are bilateral pars interarticularis defects at L5. There is 6 mm of anterolisthesis at L5-S1. IMPRESSION: 1. No acute localizing process in the abdomen or pelvis. 2. Hepatomegaly with fatty infiltration of the liver. 3. Stable heterogeneous lesion in the left lobe of the liver. Please see dedicated MRI abdomen 08/26/2021 for further description. 4. 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 Electronically Signed   By: Darliss Cheney M.D.   On: 03/15/2023 23:58    Procedures Procedures    Medications Ordered in ED Medications  HYDROmorphone (DILAUDID) injection 2 mg (has no administration in time range)  ondansetron (ZOFRAN-ODT) disintegrating tablet 8 mg (has no administration in time range)    ED Course/ Medical Decision Making/ A&P                                 Medical Decision Making Amount and/or Complexity of Data Reviewed Labs: ordered.   Upper abdominal pain and patient is status postcholecystectomy.  Differential diagnosis includes, but is not limited to, peptic ulcer disease, pancreatitis, cholangitis, retained common bile duct stone, diverticulitis.  I reviewed her past records, and note laparoscopic cholecystectomy on 08/26/2021.  ED visit on 03/15/2019 for similar complaints and CT of abdomen and pelvis showed hepatomegaly with fatty infiltration of the liver, stable lesion of left lobe of liver, incidental finding of spondylolisthesis L5-S1.  I have reviewed her laboratory tests and my interpretation is mild leukocytosis not significantly changed from yesterday, elevated random glucose level also not significantly changed from yesterday, elevated AST and ALT and alkaline phosphatase all essentially in  the same range as yesterday, elevated total bilirubin which has increased significantly compared with yesterday.  Overall picture is most consistent with common bile duct obstruction but no evidence of cholangitis.  I feel that she needs to have an ultrasound to evaluate her bile ducts, ultrasound not immediately available.  I had discussion with patient and her parents whether to be transferred to another facility for ultrasound or return in the morning for outpatient ultrasound and they have elected to do the latter.  I have ordered a dose of hydromorphone for pain and ondansetron for nausea.        Final Clinical Impression(s) / ED Diagnoses Final diagnoses:  None    Rx / DC Orders ED  Discharge Orders     None         Dione Booze, MD 03/17/23 [phone]

## 2023-03-17 NOTE — Plan of Care (Signed)
  Problem: Education: Goal: Knowledge of General Education information will improve Description: Including pain rating scale, medication(s)/side effects and non-pharmacologic comfort measures Outcome: Progressing   Problem: Health Behavior/Discharge Planning: Goal: Ability to manage health-related needs will improve Outcome: Adequate for Discharge   Problem: Clinical Measurements: Goal: Ability to maintain clinical measurements within normal limits will improve Outcome: Progressing Goal: Will remain free from infection Outcome: Progressing Goal: Diagnostic test results will improve Outcome: Progressing Goal: Respiratory complications will improve Outcome: Progressing Goal: Cardiovascular complication will be avoided Outcome: Progressing   Problem: Activity: Goal: Risk for activity intolerance will decrease Outcome: Progressing   Problem: Nutrition: Goal: Adequate nutrition will be maintained Outcome: Progressing   Problem: Coping: Goal: Level of anxiety will decrease Outcome: Progressing   Problem: Elimination: Goal: Will not experience complications related to bowel motility Outcome: Progressing Goal: Will not experience complications related to urinary retention Outcome: Completed/Met   Problem: Pain Management: Goal: General experience of comfort will improve Outcome: Progressing   Problem: Safety: Goal: Ability to remain free from injury will improve Outcome: Progressing   Problem: Skin Integrity: Goal: Risk for impaired skin integrity will decrease Outcome: Progressing

## 2023-03-17 NOTE — ED Provider Notes (Signed)
Patient returned after being seen overnight for a ultrasound.  Right upper quadrant ultrasound shows no evidence of common bile duct obstruction.  However patient has now been seen twice in the last few days for abdominal pain with elevated LFTs and total bilirubin which are still going up.  When discussing the results with the patient and her parents she is still having abdominal pain and this appears to be new as she was not having any issues after having cholecystectomy.  Offered the patient to check back in to [facility] so that we can consult GI as patient will most likely need admission.  However they would like to go directly to [facility] for evaluation and GI consult.   Gwyneth Sprout, MD 03/17/23 [phone]

## 2023-03-17 NOTE — Discharge Instructions (Addendum)
Return in the morning for ultrasound to evaluate for possible common bile duct stone.

## 2023-03-17 NOTE — H&P (Addendum)
History and Physical  Amanda [name]:[id number] DOB: 1995-09-25 DOA: 03/17/2023  PCP: Camie Patience, FNP   Chief Complaint: Abdominal pain  HPI: Amanda [name] is a 27 y.o. female with medical history significant for obesity and prior laparoscopic cholecystectomy 2023 being admitted to the hospital with upper abdominal pain, abnormal LFTs and jaundice.  Pain has been going on for the last 3 days, somewhat reminiscent of when she had her gallbladder removed.  She has come to the ER in the last couple of days, workup including CT have been unremarkable for visible acute process.  However LFTs have continued to rise, bilirubin has continued to rise gradually as well.  Denies fevers, vomiting, diarrhea, or other complaints.  ER provider discussed with gastroenterology who recommends MRCP and hospitalist admission.  Review of Systems: Please see HPI for pertinent positives and negatives. A complete 10 system review of systems are otherwise negative.  Past Medical History:  Diagnosis Date   Obesity    Past Surgical History:  Procedure Laterality Date   CHOLECYSTECTOMY N/A 08/26/2021   Procedure: LAPAROSCOPIC CHOLECYSTECTOMY;  Surgeon: Kinsinger, De Blanch, MD;  Location: [facility] OR;  Service: General;  Laterality: N/A;   Social History:  reports that she has never smoked. She has never used smokeless tobacco. She reports that she does not currently use alcohol. She reports that she does not use drugs.  No Known Allergies  Family History  Problem Relation Age of Onset   Diabetes Mother      Prior to Admission medications   Medication Sig Start Date End Date Taking? Authorizing Provider  dicyclomine (BENTYL) 20 MG tablet Take 1 tablet (20 mg total) by mouth 2 (two) times daily. 03/16/23   Maxwell Marion, PA-C  ibuprofen (ADVIL) 200 MG tablet Take 600 mg by mouth every 6 (six) hours as needed.    [provider]  oxyCODONE (OXY IR/ROXICODONE) 5 MG immediate release tablet Take 1  tablet (5 mg total) by mouth every 6 (six) hours as needed for moderate pain. 08/27/21   Griselda Miner, MD  SPRINTEC 28 0.25-35 MG-MCG tablet Take 1 tablet by mouth daily. 06/20/21   [provider]    Physical Exam: BP (!) 161/104   Pulse 75   Temp 98.4 F (36.9 C) (Oral)   Resp 13   Ht 5\' 6"  (1.676 m)   Wt 112 kg   LMP 02/21/2023   SpO2 93%   BMI 39.85 kg/m  General:  Alert, oriented, calm, in no acute distress, sitting up in a stretcher, family at bedside Cardiovascular: RRR, no murmurs or rubs, no peripheral edema  Respiratory: clear to auscultation bilaterally, no wheezes, no crackles  Abdomen: soft, tender especially in RUQ, nondistended, normal bowel tones heard  Skin: dry, no rashes  Musculoskeletal: no joint effusions, normal range of motion  Psychiatric: appropriate affect, normal speech  Neurologic: extraocular muscles intact, clear speech, moving all extremities with intact sensorium         Labs on Admission:  Basic Metabolic Panel: Recent Labs  Lab 03/15/23 2127 03/16/23 2159 03/17/23 1456  NA 135 135 136  K 3.8 3.9 4.6  CL 98 97* 98  CO2 25 25 27   GLUCOSE 123* 122* 172*  BUN 9 8 10   CREATININE 0.58 0.56 0.52  CALCIUM 9.3 9.5 9.3   Liver Function Tests: Recent Labs  Lab 03/15/23 2127 03/16/23 2159 03/17/23 1456  AST 162* 132* 138*  ALT 265* 281* 272*  ALKPHOS 243* 275* 260*  BILITOT 4.1* 5.7* 6.2*  PROT 7.7 8.0 7.8  ALBUMIN 4.2 4.2 3.5   Recent Labs  Lab 03/15/23 2127 03/16/23 2159  LIPASE <10* <10*   No results for input(s): "AMMONIA" in the last 168 hours. CBC: Recent Labs  Lab 03/15/23 2127 03/16/23 2159 03/17/23 1456  WBC 13.4* 12.6* 12.0*  HGB 12.4 12.6 12.4  HCT 39.0 39.6 39.3  MCV 77.2* 77.6* 78.8*  PLT 335 347 337   Cardiac Enzymes: No results for input(s): "CKTOTAL", "CKMB", "CKMBINDEX", "TROPONINI" in the last 168 hours. BNP (last 3 results) No results for input(s): "BNP" in the last 8760 hours.  ProBNP  (last 3 results) No results for input(s): "PROBNP" in the last 8760 hours.  CBG: No results for input(s): "GLUCAP" in the last 168 hours.  Radiological Exams on Admission: US ABDOMEN LIMITED RUQ (LIVER/GB) Result Date: 03/17/2023 CLINICAL DATA:  Elevated liver function tests. EXAM: ULTRASOUND ABDOMEN LIMITED RIGHT UPPER QUADRANT COMPARISON:  Abdominal ultrasound dated 08/25/2021 and MR abdomen dated 08/26/2021. FINDINGS: Gallbladder: Surgically absent.  No sonographic Murphy sign noted by sonographer. Common bile duct: Diameter: 5 mm Liver: A hypoechoic lesion in the left hepatic lobe measures 2.4 x 2.8 x 3.8 cm. This appears decreased in size compared to 08/25/2021. Increased parenchymal echogenicity. Portal vein is patent on color Doppler imaging with normal direction of blood flow towards the liver. Other: None. IMPRESSION: 1. Hypoechoic lesion in the left hepatic lobe appears decreased in size compared to 08/25/2021. 2. Increased hepatic parenchymal echogenicity is nonspecific but can be seen in the setting of hepatic steatosis. Electronically Signed   By: Romona Curls M.D.   On: 03/17/2023 09:03   CT ABDOMEN PELVIS W CONTRAST Result Date: 03/15/2023 CLINICAL DATA:  Acute abdominal pain EXAM: CT ABDOMEN AND PELVIS WITH CONTRAST TECHNIQUE: Multidetector CT imaging of the abdomen and pelvis was performed using the standard protocol following bolus administration of intravenous contrast. RADIATION DOSE REDUCTION: This exam was performed according to the departmental dose-optimization program which includes automated exposure control, adjustment of the mA and/or kV according to patient size and/or use of iterative reconstruction technique. CONTRAST:  OMNIPAQUE IOHEXOL 300 MG/ML  SOLN COMPARISON:  MRI abdomen 08/26/2021 FINDINGS: Lower chest: No acute abnormality. Hepatobiliary: The liver is enlarged. There is fatty infiltration of the liver. Subtle heterogeneous lesion in the left lobe of the  liver measures 4.4 by 3.1 cm similar to prior MRI. No definite new liver lesions are identified. Patient is status post cholecystectomy. There is no biliary ductal dilatation. Pancreas: Unremarkable. No pancreatic ductal dilatation or surrounding inflammatory changes. Spleen: Normal in size without focal abnormality. Adrenals/Urinary Tract: Adrenal glands are unremarkable. Kidneys are normal, without renal calculi, focal lesion, or hydronephrosis. Bladder is unremarkable. Stomach/Bowel: Stomach is within normal limits. Appendix appears normal. No evidence of bowel wall thickening, distention, or inflammatory changes. Vascular/Lymphatic: No significant vascular findings are present. No enlarged abdominal or pelvic lymph nodes. Reproductive: Uterus and bilateral adnexa are unremarkable. Other: No abdominal wall hernia or abnormality. No abdominopelvic ascites. Musculoskeletal: There are bilateral pars interarticularis defects at L5. There is 6 mm of anterolisthesis at L5-S1. IMPRESSION: 1. No acute localizing process in the abdomen or pelvis. 2. Hepatomegaly with fatty infiltration of the liver. 3. Stable heterogeneous lesion in the left lobe of the liver. Please see dedicated MRI abdomen 08/26/2021 for further description. 4. 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 Electronically Signed   By: Darliss Cheney M.D.   On:  03/15/2023 23:58   Assessment/Plan TEHILA COSTANZO is a 27 y.o. female with medical history significant for obesity and prior laparoscopic cholecystectomy 2023 being admitted to the hospital with upper abdominal pain, abnormal LFTs and jaundice.   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  No ductal dilatation, mass, etc. seen on ultrasound and CT.  Doubt infectious cause. -Observation admission -Regular diet -Avoid hepatotoxins -Appreciate GI consultation -Follow-up MRCP -Follow up acute  hepatitis panel -Pain and nausea medication as needed  Leukocytosis-likely reactive, no evidence of acute infection  DVT prophylaxis: Lovenox     Code Status: Full Code  Consults called: Gastroenterology Dr. Dulce Sellar  Admission status: Observation  Time spent: 59 minutes  Olvin Rohr Sharlette Dense MD [facility] Pager [phone]  If 7PM-7AM, please contact night-coverage [email or web address] Password [email or web address]  03/17/2023, 4:46 PM

## 2023-03-17 NOTE — ED Provider Notes (Signed)
[place] EMERGENCY DEPARTMENT AT [facility] Provider Note   CSN: [id number] Arrival date & time: 03/17/23  1009     History  Chief Complaint  Patient presents with   Abdominal Pain    Amanda [name] is a 27 y.o. female.  Patient with history of ADHD, prediabetes, hepatic steatosis, obesity presents today with complaints of RUQ pain. Patient states that same began initially on 12/19. Was seen for same and had elevated liver enzymes but a normal CT scan, was thought to be due to her hepatic steatosis, was discharged and recommended to follow-up with GI outpatient. Of note, patient had cholecystectomy 1.5 years ago and has not had any complications from this procedure. States that she tried to manage her pain at home but was unable to and returned to the [facility] ER for evaluation. She had repeat labs which again showed up trending liver enzymes and it was recommended that she had a RUQ [place], however this study was not available at that time and she was told to return this morning. Her imaging again showed hepatic steatosis, however the provider who spoke to her about these results recommended she check back in and have GI consult and admission. She preferred to have this done here and presents for same. She continues to have intractable pain. No nausea, vomiting, or diarrhea. States that she is having green colored stools. She is also jaundiced. Denies any alcohol abuse, tylenol use, or IVDU. No fevers or chills.   The history is provided by the patient. No language interpreter was used.  Abdominal Pain      Home Medications Prior to Admission medications   Medication Sig Start Date End Date Taking? Authorizing Provider  dicyclomine (BENTYL) 20 MG tablet Take 1 tablet (20 mg total) by mouth 2 (two) times daily. 03/16/23   Maxwell Marion, PA-C  ibuprofen (ADVIL) 200 MG tablet Take 600 mg by mouth every 6 (six) hours as needed.    [provider]  oxyCODONE (OXY  IR/ROXICODONE) 5 MG immediate release tablet Take 1 tablet (5 mg total) by mouth every 6 (six) hours as needed for moderate pain. 08/27/21   Griselda Miner, MD  SPRINTEC 28 0.25-35 MG-MCG tablet Take 1 tablet by mouth daily. 06/20/21   [provider]      Allergies    Patient has no known allergies.    Review of Systems   Review of Systems  Gastrointestinal:  Positive for abdominal pain.  All other systems reviewed and are negative.   Physical Exam Updated Vital Signs BP (!) 161/104   Pulse 75   Temp 98.4 F (36.9 C) (Oral)   Resp 13   Ht 5\' 6"  (1.676 m)   Wt 112 kg   LMP 02/21/2023   SpO2 93%   BMI 39.85 kg/m  Physical Exam Vitals and nursing note reviewed.  Constitutional:      General: She is not in acute distress.    Appearance: Normal appearance. She is obese. She is not ill-appearing, toxic-appearing or diaphoretic.  HENT:     Head: Normocephalic and atraumatic.  Eyes:     General: Scleral icterus present.  Cardiovascular:     Rate and Rhythm: Normal rate.  Pulmonary:     Effort: Pulmonary effort is normal. No respiratory distress.  Abdominal:     General: Abdomen is flat.     Palpations: Abdomen is soft.     Tenderness: There is abdominal tenderness in the right upper quadrant.  Musculoskeletal:        General: Normal range of motion.     Cervical back: Normal range of motion.  Skin:    General: Skin is warm and dry.     Coloration: Skin is jaundiced.  Neurological:     General: No focal deficit present.     Mental Status: She is alert.  Psychiatric:        Mood and Affect: Mood normal.        Behavior: Behavior normal.     ED Results / Procedures / Treatments   Labs (all labs ordered are listed, but only abnormal results are displayed) Labs Reviewed  CBC - Abnormal; Notable for the following components:      Result Value   WBC 12.0 (*)    MCV 78.8 (*)    MCH 24.8 (*)    RDW 15.6 (*)    All other components within normal limits  BASIC  METABOLIC PANEL - Abnormal; Notable for the following components:   Glucose, Bld 172 (*)    All other components within normal limits  HEPATIC FUNCTION PANEL - Abnormal; Notable for the following components:   AST 138 (*)    ALT 272 (*)    Alkaline Phosphatase 260 (*)    Total Bilirubin 6.2 (*)    Bilirubin, Direct 4.0 (*)    Indirect Bilirubin 2.2 (*)    All other components within normal limits  HEPATITIS PANEL, ACUTE  HIV ANTIBODY (ROUTINE TESTING W REFLEX)  COMPREHENSIVE METABOLIC PANEL  CBC    EKG None  Radiology US ABDOMEN LIMITED RUQ (LIVER/GB) Result Date: 03/17/2023 CLINICAL DATA:  Elevated liver function tests. EXAM: ULTRASOUND ABDOMEN LIMITED RIGHT UPPER QUADRANT COMPARISON:  Abdominal ultrasound dated 08/25/2021 and MR abdomen dated 08/26/2021. FINDINGS: Gallbladder: Surgically absent.  No sonographic Murphy sign noted by sonographer. Common bile duct: Diameter: 5 mm Liver: A hypoechoic lesion in the left hepatic lobe measures 2.4 x 2.8 x 3.8 cm. This appears decreased in size compared to 08/25/2021. Increased parenchymal echogenicity. Portal vein is patent on color Doppler imaging with normal direction of blood flow towards the liver. Other: None. IMPRESSION: 1. Hypoechoic lesion in the left hepatic lobe appears decreased in size compared to 08/25/2021. 2. Increased hepatic parenchymal echogenicity is nonspecific but can be seen in the setting of hepatic steatosis. Electronically Signed   By: Romona Curls M.D.   On: 03/17/2023 09:03   CT ABDOMEN PELVIS W CONTRAST Result Date: 03/15/2023 CLINICAL DATA:  Acute abdominal pain EXAM: CT ABDOMEN AND PELVIS WITH CONTRAST TECHNIQUE: Multidetector CT imaging of the abdomen and pelvis was performed using the standard protocol following bolus administration of intravenous contrast. RADIATION DOSE REDUCTION: This exam was performed according to the departmental dose-optimization program which includes automated exposure control,  adjustment of the mA and/or kV according to patient size and/or use of iterative reconstruction technique. CONTRAST:  OMNIPAQUE IOHEXOL 300 MG/ML  SOLN COMPARISON:  MRI abdomen 08/26/2021 FINDINGS: Lower chest: No acute abnormality. Hepatobiliary: The liver is enlarged. There is fatty infiltration of the liver. Subtle heterogeneous lesion in the left lobe of the liver measures 4.4 by 3.1 cm similar to prior MRI. No definite new liver lesions are identified. Patient is status post cholecystectomy. There is no biliary ductal dilatation. Pancreas: Unremarkable. No pancreatic ductal dilatation or surrounding inflammatory changes. Spleen: Normal in size without focal abnormality. Adrenals/Urinary Tract: Adrenal glands are unremarkable. Kidneys are normal, without renal calculi, focal lesion, or hydronephrosis. Bladder is unremarkable. Stomach/Bowel: Stomach  is within normal limits. Appendix appears normal. No evidence of bowel wall thickening, distention, or inflammatory changes. Vascular/Lymphatic: No significant vascular findings are present. No enlarged abdominal or pelvic lymph nodes. Reproductive: Uterus and bilateral adnexa are unremarkable. Other: No abdominal wall hernia or abnormality. No abdominopelvic ascites. Musculoskeletal: There are bilateral pars interarticularis defects at L5. There is 6 mm of anterolisthesis at L5-S1. IMPRESSION: 1. No acute localizing process in the abdomen or pelvis. 2. Hepatomegaly with fatty infiltration of the liver. 3. Stable heterogeneous lesion in the left lobe of the liver. Please see dedicated MRI abdomen 08/26/2021 for further description. 4. 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 Electronically Signed   By: Darliss Cheney M.D.   On: 03/15/2023 23:58    Procedures Procedures    Medications Ordered in ED Medications  LORazepam (ATIVAN) injection 0.5 mg (has no administration in time range)  enoxaparin (LOVENOX)  injection 40 mg (has no administration in time range)  ibuprofen (ADVIL) tablet 400 mg (has no administration in time range)  traZODone (DESYREL) tablet 25 mg (has no administration in time range)  ondansetron (ZOFRAN) tablet 4 mg (has no administration in time range)    Or  ondansetron (ZOFRAN) injection 4 mg (has no administration in time range)  oxyCODONE (Oxy IR/ROXICODONE) immediate release tablet 5 mg (has no administration in time range)  morphine (PF) 4 MG/ML injection 4 mg (4 mg Intravenous Given 03/17/23 1606)    ED Course/ Medical Decision Making/ A&P                                 Medical Decision Making Amount and/or Complexity of Data Reviewed Labs: ordered. Radiology: ordered.  Risk Prescription drug management. Decision regarding hospitalization.   This patient is a 27 y.o. female who presents to the ED for concern of RUQ abdominal pain, this involves an extensive number of treatment options, and is a complaint that carries with it a high risk of complications and morbidity. The emergent differential diagnosis prior to evaluation includes, but is not limited to,  Glabladder disease, PUD, Acute Hepatitis, Pancreatitis, pyelonephritis, Pneumonia, Lower lobe PE/Infarct, Kidney stone, GERD, retrocecal appendicitis, Fitz-Hugh-Curtis syndrome, AAA, MI, Zoster.   This is not an exhaustive differential.   Past Medical History / Co-morbidities / Social History:  has a past medical history of Obesity.  Had uncomplicated laparoscopic cholecystectomy in 6/23  Additional history: Chart reviewed. Pertinent results include: seen twice previously. Has up-trending liver enzymes, Ct and RUQ [place] which shows hepatic steatosis  Physical Exam: Physical exam performed. The pertinent findings include: jaundice present. RUQ TTP   Lab Tests: I ordered, and personally interpreted labs.  The pertinent results include:  WBC 12, T bili 4.1 --> 5.7 --> 6.2 AST 138, ALT 272, Alk phos 260.  Hepatitis panel pending   Imaging Studies: I ordered imaging studies including MRCP. Study pending at admission   Medications: I ordered medication including morphine for pain. Reevaluation of the patient after these medicines showed that the patient improved. I have reviewed the patients home medicines and have made adjustments as needed.  Consultations Obtained: I requested consultation with the GI on call Dr. Dulce Sellar,  and discussed lab and imaging findings as well as pertinent plan - they recommend: MRCP with concern for retained bile duct stone, hospitalist admission. They will see the patient in consultation   Disposition: After consideration of the diagnostic results  and the patients response to treatment, I feel that patient will require admission for MRCP, GI consult given intractable RUQ pain and uptrending liver enzymes.   Discussed patient with hospitalist Dr. Kirby Crigler who accepts patient for admission.  I discussed this case with my attending physician Dr. Denton Lank who cosigned this note including patient's presenting symptoms, physical exam, and planned diagnostics and interventions. Attending physician stated agreement with plan or made changes to plan which were implemented.    Final Clinical Impression(s) / ED Diagnoses Final diagnoses:  Right upper quadrant pain  Transaminitis    Rx / DC Orders ED Discharge Orders     None         Vear Clock 03/17/23 1713    Cathren Laine, MD 03/17/23 [phone]

## 2023-03-17 NOTE — ED Triage Notes (Signed)
Patient c/o abd pain x 3 days Generalized Went to other ED yesterday and 2 days ago had ultrasound and CT  Says everything normal, but was told to come to [facility]  Does report liver enzymes elevated Pain rated 10/10

## 2023-03-18 ENCOUNTER — Observation Stay (HOSPITAL_COMMUNITY): Payer: BC Managed Care – PPO

## 2023-03-18 DIAGNOSIS — Z9049 Acquired absence of other specified parts of digestive tract: Secondary | ICD-10-CM | POA: Diagnosis not present

## 2023-03-18 DIAGNOSIS — R932 Abnormal findings on diagnostic imaging of liver and biliary tract: Secondary | ICD-10-CM | POA: Diagnosis not present

## 2023-03-18 DIAGNOSIS — Z79899 Other long term (current) drug therapy: Secondary | ICD-10-CM | POA: Diagnosis not present

## 2023-03-18 DIAGNOSIS — K5900 Constipation, unspecified: Secondary | ICD-10-CM | POA: Diagnosis present

## 2023-03-18 DIAGNOSIS — R7401 Elevation of levels of liver transaminase levels: Secondary | ICD-10-CM | POA: Diagnosis present

## 2023-03-18 DIAGNOSIS — K760 Fatty (change of) liver, not elsewhere classified: Secondary | ICD-10-CM | POA: Diagnosis present

## 2023-03-18 DIAGNOSIS — R030 Elevated blood-pressure reading, without diagnosis of hypertension: Secondary | ICD-10-CM | POA: Diagnosis present

## 2023-03-18 DIAGNOSIS — K838 Other specified diseases of biliary tract: Secondary | ICD-10-CM | POA: Diagnosis not present

## 2023-03-18 DIAGNOSIS — R7989 Other specified abnormal findings of blood chemistry: Secondary | ICD-10-CM | POA: Diagnosis not present

## 2023-03-18 DIAGNOSIS — E66812 Obesity, class 2: Secondary | ICD-10-CM | POA: Diagnosis present

## 2023-03-18 DIAGNOSIS — Z6839 Body mass index (BMI) 39.0-39.9, adult: Secondary | ICD-10-CM | POA: Diagnosis not present

## 2023-03-18 DIAGNOSIS — R1011 Right upper quadrant pain: Secondary | ICD-10-CM | POA: Diagnosis not present

## 2023-03-18 DIAGNOSIS — R109 Unspecified abdominal pain: Secondary | ICD-10-CM | POA: Diagnosis not present

## 2023-03-18 DIAGNOSIS — R748 Abnormal levels of other serum enzymes: Secondary | ICD-10-CM | POA: Insufficient documentation

## 2023-03-18 DIAGNOSIS — K8050 Calculus of bile duct without cholangitis or cholecystitis without obstruction: Secondary | ICD-10-CM | POA: Diagnosis present

## 2023-03-18 DIAGNOSIS — Z833 Family history of diabetes mellitus: Secondary | ICD-10-CM | POA: Diagnosis not present

## 2023-03-18 DIAGNOSIS — F909 Attention-deficit hyperactivity disorder, unspecified type: Secondary | ICD-10-CM | POA: Diagnosis present

## 2023-03-18 DIAGNOSIS — R17 Unspecified jaundice: Secondary | ICD-10-CM | POA: Diagnosis present

## 2023-03-18 DIAGNOSIS — K8051 Calculus of bile duct without cholangitis or cholecystitis with obstruction: Secondary | ICD-10-CM | POA: Diagnosis not present

## 2023-03-18 DIAGNOSIS — R7303 Prediabetes: Secondary | ICD-10-CM | POA: Diagnosis present

## 2023-03-18 DIAGNOSIS — D1803 Hemangioma of intra-abdominal structures: Secondary | ICD-10-CM | POA: Diagnosis present

## 2023-03-18 LAB — COMPREHENSIVE METABOLIC PANEL
ALT: 259 U/L — ABNORMAL HIGH (ref 0–44)
AST: 111 U/L — ABNORMAL HIGH (ref 15–41)
Albumin: 3.4 g/dL — ABNORMAL LOW (ref 3.5–5.0)
Alkaline Phosphatase: 252 U/L — ABNORMAL HIGH (ref 38–126)
Anion gap: 13 (ref 5–15)
BUN: 11 mg/dL (ref 6–20)
CO2: 24 mmol/L (ref 22–32)
Calcium: 9 mg/dL (ref 8.9–10.3)
Chloride: 100 mmol/L (ref 98–111)
Creatinine, Ser: 0.6 mg/dL (ref 0.44–1.00)
GFR, Estimated: >60 mL/min (ref >60)
Glucose, Bld: 119 mg/dL — ABNORMAL HIGH (ref 70–99)
Potassium: 3.9 mmol/L (ref 3.5–5.1)
Sodium: 137 mmol/L (ref 135–145)
Total Bilirubin: 5.4 mg/dL — ABNORMAL HIGH (ref <1.2)
Total Protein: 7.6 g/dL (ref 6.5–8.1)

## 2023-03-18 LAB — CBC
HCT: 40.3 % (ref 36.0–46.0)
Hemoglobin: 12.4 g/dL (ref 12.0–15.0)
MCH: 24.7 pg — ABNORMAL LOW (ref 26.0–34.0)
MCHC: 30.8 g/dL (ref 30.0–36.0)
MCV: 80.3 fL (ref 80.0–100.0)
Platelets: 355 10*3/uL (ref 150–400)
RBC: 5.02 MIL/uL (ref 3.87–5.11)
RDW: 15.9 % — ABNORMAL HIGH (ref 11.5–15.5)
WBC: 10.9 10*3/uL — ABNORMAL HIGH (ref 4.0–10.5)
nRBC: 0 % (ref 0.0–0.2)

## 2023-03-18 LAB — HIV ANTIBODY (ROUTINE TESTING W REFLEX): HIV Screen 4th Generation wRfx: NONREACTIVE

## 2023-03-18 MED ORDER — SODIUM CHLORIDE 0.9 % IV SOLN
2.0000 g | Freq: Every day | INTRAVENOUS | Status: DC
  Administered 2023-03-18 – 2023-03-20 (×3): 2 g via INTRAVENOUS
  Filled 2023-03-18 (×3): qty 20

## 2023-03-18 MED ORDER — DEXTROSE-SODIUM CHLORIDE 5-0.9 % IV SOLN: INTRAVENOUS | Status: AC

## 2023-03-18 MED ORDER — FENTANYL CITRATE PF 50 MCG/ML IJ SOSY: 25.0000 ug | PREFILLED_SYRINGE | INTRAMUSCULAR | Status: DC | PRN

## 2023-03-18 MED ORDER — METRONIDAZOLE 500 MG/100ML IV SOLN
500.0000 mg | Freq: Two times a day (BID) | INTRAVENOUS | Status: DC
Start: 2023-03-18 — End: 2023-03-21
  Administered 2023-03-18 – 2023-03-20 (×5): 500 mg via INTRAVENOUS
  Filled 2023-03-18 (×5): qty 100

## 2023-03-18 MED ORDER — GADOBUTROL 1 MMOL/ML IV SOLN
10.0000 mL | Freq: Once | INTRAVENOUS | Status: AC | PRN
  Administered 2023-03-18: 10 mL via INTRAVENOUS

## 2023-03-18 NOTE — Plan of Care (Signed)
  Problem: Education: Goal: Knowledge of General Education information will improve Description: Including pain rating scale, medication(s)/side effects and non-pharmacologic comfort measures Outcome: Progressing   Problem: Pain Management: Goal: General experience of comfort will improve Outcome: Progressing   Problem: Safety: Goal: Ability to remain free from injury will improve Outcome: Progressing

## 2023-03-18 NOTE — Consult Note (Signed)
[facility] Consultation Note  Referring Provider: [facility] Primary Care Physician:  Camie Patience, FNP Primary Gastroenterologist:  Gentry Fitz  Reason for Consultation:  abdominal pain, elevated LFTs  HPI: Amanda [name] is a 27 y.o. female admitted abdominal pain (right upper quadrant and epigastric) as well as elevated LFTs.  Started about 5 days ago, intermittent, pain can radiate to back, resembles prior gallbladder pain (removed 2023).  MRCP showed CBD 5 mm with 3 mm distal CBD stone.  Pain less now upon admission.  Having dark urine and light stools.  No fevers.   Past Medical History:  Diagnosis Date   Obesity     Past Surgical History:  Procedure Laterality Date   CHOLECYSTECTOMY N/A 08/26/2021   Procedure: LAPAROSCOPIC CHOLECYSTECTOMY;  Surgeon: Kinsinger, De Blanch, MD;  Location: [facility] OR;  Service: General;  Laterality: N/A;    Prior to Admission medications   Medication Sig Start Date End Date Taking? Authorizing Provider  acetaminophen (TYLENOL) 500 MG tablet Take 1,000 mg by mouth every 6 (six) hours as needed for moderate pain (pain score 4-6) or mild pain (pain score 1-3).   Yes [provider]  SPRINTEC 28 0.25-35 MG-MCG tablet Take 1 tablet by mouth daily. 06/20/21  Yes [provider]    Current Facility-Administered Medications  Medication Dose Route Frequency Provider Last Rate Last Admin   cefTRIAXone (ROCEPHIN) 2 g in sodium chloride 0.9 % 100 mL IVPB  2 g Intravenous Q24H Gonfa, Taye T, MD       dextrose 5 %-0.9 % sodium chloride infusion   Intravenous Continuous Candelaria Stagers T, MD 75 mL/hr at 03/18/23 1104 New Bag at 03/18/23 1104   fentaNYL (SUBLIMAZE) injection 25 mcg  25 mcg Intravenous Q2H PRN Almon Hercules, MD       metroNIDAZOLE (FLAGYL) IVPB 500 mg  500 mg Intravenous Q12H Almon Hercules, MD       ondansetron (ZOFRAN) tablet 4 mg  4 mg Oral Q6H PRN Kirby Crigler, Mir M, MD       Or   ondansetron [facility]) injection 4  mg  4 mg Intravenous Q6H PRN Kirby Crigler, Mir M, MD       oxyCODONE (Oxy IR/ROXICODONE) immediate release tablet 5 mg  5 mg Oral Q4H PRN Kirby Crigler, Mir M, MD       traZODone (DESYREL) tablet 25 mg  25 mg Oral QHS PRN Maryln Gottron, MD        Allergies as of 03/17/2023   (No Known Allergies)    Family History  Problem Relation Age of Onset   Diabetes Mother     Social History   Socioeconomic History   Marital status: Single    Spouse name: Not on file   Number of children: Not on file   Years of education: Not on file   Highest education level: Not on file  Occupational History   Not on file  Tobacco Use   Smoking status: Never   Smokeless tobacco: Never  Substance and Sexual Activity   Alcohol use: Not Currently   Drug use: Never   Sexual activity: Not on file  Other Topics Concern   Not on file  Social History Narrative   Not on file   Social Drivers of Health   Financial Resource Strain: Not on file  Food Insecurity: Patient Declined (03/17/2023)   Hunger Vital Sign    Worried About Running Out of Food in the Last Year: Patient declined    Ran  Out of Food in the Last Year: Patient declined  Transportation Needs: Patient Declined (03/17/2023)   PRAPARE - Administrator, Civil Service (Medical): Patient declined    Lack of Transportation (Non-Medical): Patient declined  Physical Activity: Not on file  Stress: Not on file  Social Connections: Not on file  Intimate Partner Violence: Patient Declined (03/17/2023)   Humiliation, Afraid, Rape, and Kick questionnaire    Fear of Current or Ex-Partner: Patient declined    Emotionally Abused: Patient declined    Physically Abused: Patient declined    Sexually Abused: Patient declined    Review of Systems: As per HPI, all others negative  Physical Exam: Vital signs in last 24 hours: Temp:  [98 F (36.7 C)-98.7 F (37.1 C)] 98.7 F (37.1 C) (12/22 0509) Pulse Rate:  [76-82] 76 (12/22 0509) Resp:   [13-16] 16 (12/22 0509) BP: (146-152)/(84-95) 146/84 (12/22 0509) SpO2:  [95 %-97 %] 97 % (12/22 0509) Last BM Date : 03/16/23 General:   Alert,  overweight, pleasant and cooperative in NAD Head:  Normocephalic and atraumatic. Eyes:  Sclera clear, icteric bilaterally.   Conjunctiva pink. Ears:  Normal auditory acuity. Nose:  No deformity, discharge,  or lesions. Mouth:  No deformity or lesions.  Oropharynx pink & moist. Neck:  Supple; no masses or thyromegaly. Lungs:  No visible distress Abdomen:  Soft, mild epigastric and right upper quadrant tenderness without peritonitis. No masses, hepatosplenomegaly or hernias noted.    Msk:  Symmetrical without gross deformities. Normal posture. Pulses:  Normal pulses noted. Extremities:  Without clubbing or edema. Neurologic:  Alert and  oriented x4;  grossly normal neurologically. Skin:  Mildly jaundiced,  Intact without significant lesions or rashes. Psych:  Alert and cooperative. Normal mood and affect.   Lab Results: Recent Labs    03/16/23 2159 03/17/23 1456 03/18/23 0353  WBC 12.6* 12.0* 10.9*  HGB 12.6 12.4 12.4  HCT 39.6 39.3 40.3  PLT 347 337 355   BMET Recent Labs    03/16/23 2159 03/17/23 1456 03/18/23 0353  NA 135 136 137  K 3.9 4.6 3.9  CL 97* 98 100  CO2 25 27 24   GLUCOSE 122* 172* 119*  BUN 8 10 11   CREATININE 0.56 0.52 0.60  CALCIUM 9.5 9.3 9.0   LFT Recent Labs    03/17/23 1456 03/18/23 0353  PROT 7.8 7.6  ALBUMIN 3.5 3.4*  AST 138* 111*  ALT 272* 259*  ALKPHOS 260* 252*  BILITOT 6.2* 5.4*  BILIDIR 4.0*  --   IBILI 2.2*  --    PT/INR No results for input(s): "LABPROT", "INR" in the last 72 hours.  Studies/Results: MR ABDOMEN MRCP W WO CONTAST Result Date: 03/18/2023 CLINICAL DATA:  Right upper quadrant pain. Biliary disease suspected. EXAM: MRI ABDOMEN WITHOUT AND WITH CONTRAST (INCLUDING MRCP) TECHNIQUE: Multiplanar multisequence MR imaging of the abdomen was performed both before and after  the administration of intravenous contrast. Heavily T2-weighted images of the biliary and pancreatic ducts were obtained, and three-dimensional MRCP images were rendered by post processing. CONTRAST:  10mL GADAVIST GADOBUTROL 1 MMOL/ML IV SOLN COMPARISON:  Right upper quadrant sonogram 03/17/2023 and CT AP 03/15/2023 FINDINGS: Lower chest: No acute abnormality. Hepatobiliary: Hepatic steatosis. Further decrease in size of atypical enhancing lesion within lateral segment of the left hepatic lobe on today's exam this measures 4.2 x 2.8 cm, image 38/18. On the prior exam this measured 4.3 x 3.6 cm (by my measurements.). This was previously characterized as a benign  atypical hemangioma. There is a small nonenhancing T2 hypointense and T1 hypointense lesion within the periphery of the right lobe of liver measuring 5 mm. This does not show enhancement on the postcontrast images. Unchanged from 08/26/2021. Status post cholecystectomy. Normal caliber common bile duct measuring 4 mm in diameter. Focal dilatation of the distal common bile duct just before the ampulla measures 5 mm. There is a small stone scratch set here, there is a small stone measuring 3 mm, image 21/10. Pancreas: No mass, inflammatory changes, or other parenchymal abnormality identified. Spleen:  Within normal limits in size and appearance. Adrenals/Urinary Tract: Normal adrenal glands. No nephrolithiasis, hydronephrosis or suspicious mass. Stomach/Bowel: No dilated bowel loops identified. No wall thickening or inflammatory changes identified. Vascular/Lymphatic: Patent upper abdominal vascularity. Normal caliber abdominal aorta. No adenopathy. Other:  No free fluid or fluid collection. Musculoskeletal: No suspicious bone lesions identified. IMPRESSION: 1. Status post cholecystectomy. 2. Focal dilatation of the distal common bile duct just before the ampulla measures 5 mm. There is a small stone measuring 3 mm within the distal common bile duct. Findings  compatible with choledocholithiasis. 3. Slight decrease in size of atypical enhancing lesion within the lateral segment of the left hepatic lobe. 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 4. Hepatic steatosis. Electronically Signed   By: Signa Kell M.D.   On: 03/18/2023 07:37   MR 3D Recon At Scanner Result Date: 03/18/2023 CLINICAL DATA:  Right upper quadrant pain. Biliary disease suspected. EXAM: MRI ABDOMEN WITHOUT AND WITH CONTRAST (INCLUDING MRCP) TECHNIQUE: Multiplanar multisequence MR imaging of the abdomen was performed both before and after the administration of intravenous contrast. Heavily T2-weighted images of the biliary and pancreatic ducts were obtained, and three-dimensional MRCP images were rendered by post processing. CONTRAST:  10mL GADAVIST GADOBUTROL 1 MMOL/ML IV SOLN COMPARISON:  Right upper quadrant sonogram 03/17/2023 and CT AP 03/15/2023 FINDINGS: Lower chest: No acute abnormality. Hepatobiliary: Hepatic steatosis. Further decrease in size of atypical enhancing lesion within lateral segment of the left hepatic lobe on today's exam this measures 4.2 x 2.8 cm, image 38/18. On the prior exam this measured 4.3 x 3.6 cm (by my measurements.). 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 There is a small nonenhancing T2 hypointense and T1 hypointense lesion within the periphery of the right lobe of liver measuring 5 mm. This does not show enhancement on the postcontrast images. Unchanged from 08/26/2021. Status post cholecystectomy. Normal caliber common bile duct measuring 4 mm in diameter. Focal dilatation of the distal common bile duct just before the ampulla measures 5 mm. There is a small stone scratch set here, there is a small stone measuring 3 mm, image 21/10. Pancreas: No mass, inflammatory changes, or other parenchymal abnormality identified. Spleen:  Within normal limits in size and appearance. Adrenals/Urinary Tract: Normal adrenal  glands. No nephrolithiasis, hydronephrosis or suspicious mass. Stomach/Bowel: No dilated bowel loops identified. No wall thickening or inflammatory changes identified. Vascular/Lymphatic: Patent upper abdominal vascularity. Normal caliber abdominal aorta. No adenopathy. Other:  No free fluid or fluid collection. Musculoskeletal: No suspicious bone lesions identified. IMPRESSION: 1. Status post cholecystectomy. 2. Focal dilatation of the distal common bile duct just before the ampulla measures 5 mm. There is a small stone measuring 3 mm within the distal common bile duct. Findings compatible with choledocholithiasis. 3. Slight decrease in size of atypical enhancing lesion within the lateral segment of the left hepatic lobe. 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 4. Hepatic steatosis.  Electronically Signed   By: Signa Kell M.D.   On: 03/18/2023 07:37   US ABDOMEN LIMITED RUQ (LIVER/GB) Result Date: 03/17/2023 CLINICAL DATA:  Elevated liver function tests. EXAM: ULTRASOUND ABDOMEN LIMITED RIGHT UPPER QUADRANT COMPARISON:  Abdominal ultrasound dated 08/25/2021 and MR abdomen dated 08/26/2021. FINDINGS: Gallbladder: Surgically absent.  No sonographic Murphy sign noted by sonographer. Common bile duct: Diameter: 5 mm Liver: A hypoechoic lesion in the left hepatic lobe measures 2.4 x 2.8 x 3.8 cm. This appears decreased in size compared to 08/25/2021. Increased parenchymal echogenicity. Portal vein is patent on color Doppler imaging with normal direction of blood flow towards the liver. Other: None. IMPRESSION: 1. Hypoechoic lesion in the left hepatic lobe appears decreased in size compared to 08/25/2021. 2. Increased hepatic parenchymal echogenicity is nonspecific but can be seen in the setting of hepatic steatosis. Electronically Signed   By: Romona Curls M.D.   On: 03/17/2023 09:03    Impression:   Intermittent upper abdominal pains. Elevated  LFTs. Choledocholithiasis.  Plan:   Patient needs ERCP.  I have discussed with patient and she elects to proceed.  However, there is no anesthesia availability today therefore she can have clear liquid diet, NPO after midnight in hope of getting procedure done tomorrow. Risks (up to and including bleeding, infection, perforation, pancreatitis that can be complicated by infected necrosis and death), benefits (removal of stones, alleviating blockage, decreasing risk of cholangitis or choledocholithiasis-related pancreatitis), and alternatives (watchful waiting, percutaneous transhepatic cholangiography) of ERCP were explained to patient/family in detail and patient elects to proceed.  Follow LFTs and clinical course. [facility] will follow.   LOS: 0 days   Alysa Duca M  03/18/2023, 11:28 AM  Cell [phone] If no answer or after 5 PM call [phone]

## 2023-03-18 NOTE — Progress Notes (Signed)
PROGRESS NOTE  Amanda [name]:[id number] DOB: 10-03-1995   PCP: Camie Patience, FNP  Patient is from: Home.  DOA: 03/17/2023 LOS: 0  Chief complaints Chief Complaint  Patient presents with   Abdominal Pain     Brief Narrative / Interim history: 27 year old F with PMH of obesity and prior lap chole in 2023 presenting with upper abdominal pain, jaundice and elevated LFT.  CT abdomen and pelvis and RUQ [place] without significant finding.  GI consulted and recommended MRCP that showed choledocholithiasis.  Subjective: Seen and examined earlier this morning.  No major events overnight of this morning.  No complaints.  Denies nausea, vomiting, pain, fever or chills.  Objective: Vitals:   03/17/23 1700 03/17/23 1800 03/17/23 2154 03/18/23 0509  BP:  (!) 152/95 (!) 150/88 (!) 146/84  Pulse:  82 81 76  Resp:  13 16 16   Temp: 98.2 F (36.8 C) 98 F (36.7 C) 98.7 F (37.1 C) 98.7 F (37.1 C)  TempSrc:  Oral Oral Oral  SpO2:  95% 97% 97%  Weight:      Height:        Examination:  GENERAL: No apparent distress.  Nontoxic. HEENT: MMM.  Vision and hearing grossly intact.  NECK: Supple.  No apparent JVD.  RESP:  No IWOB.  Fair aeration bilaterally. CVS:  RRR. Heart sounds normal.  ABD/GI/GU: BS+. Abd soft, NTND.  MSK/EXT:  Moves extremities. No apparent deformity. No edema.  SKIN: no apparent skin lesion or wound NEURO: Awake, alert and oriented appropriately.  No apparent focal neuro deficit. PSYCH: Calm. Normal affect.   Procedures:  None  Microbiology summarized: None  Assessment and plan: Obstructive jaundice/choledocholithiasis: CT abdomen and pelvis and RUQ [place] unrevealing but MRCP with choledocholithiasis.  LFT improved some.  She has leukocytosis but no fever.  Pain resolved. -N.p.o. pending GI evaluation -Start ceftriaxone and Flagyl -IV fluid   Elevated liver enzymes/hyperbilirubinemia: Likely due to the above.  Acute hepatitis panel and HIV nonreactive.   Improved some -Continue monitoring   Leukocytosis-likely reactive but warrants coverage with antibiotics given choledocholithiasis -Antibiotics as above.  Class II obesity Body mass index is 39.85 kg/m. -Encourage lifestyle change to lose weight.          DVT prophylaxis:  Place and maintain sequential compression device Start: 03/18/23 0747 SCDs Start: 03/17/23 1646  Code Status: Full code Family Communication: Updated significant other at bedside Level of care: Med-Surg Status is: Observation The patient will require care spanning > 2 midnights and should be moved to inpatient because: Choledocholithiasis   Final disposition: Home once medically stable Consultants:  Gastroenterology  35 Minutes with more than 50% spent in reviewing records, counseling patient/family and coordinating care.   Sch Meds:  Scheduled Meds: Continuous Infusions:  dextrose 5 % and 0.9 % NaCl 75 mL/hr at 03/18/23 1104   PRN Meds:.fentaNYL (SUBLIMAZE) injection, ondansetron **OR** ondansetron (ZOFRAN) IV, oxyCODONE, traZODone  Antimicrobials: Anti-infectives (From admission, onward)    None        I have personally reviewed the following labs and images: CBC: Recent Labs  Lab 03/15/23 2127 03/16/23 2159 03/17/23 1456 03/18/23 0353  WBC 13.4* 12.6* 12.0* 10.9*  HGB 12.4 12.6 12.4 12.4  HCT 39.0 39.6 39.3 40.3  MCV 77.2* 77.6* 78.8* 80.3  PLT 335 347 337 355   BMP &GFR Recent Labs  Lab 03/15/23 2127 03/16/23 2159 03/17/23 1456 03/18/23 0353  NA 135 135 136 137  K 3.8 3.9 4.6 3.9  CL 98  97* 98 100  CO2 25 25 27 24   GLUCOSE 123* 122* 172* 119*  BUN 9 8 10 11   CREATININE 0.58 0.56 0.52 0.60  CALCIUM 9.3 9.5 9.3 9.0   Estimated Creatinine Clearance: 132.9 mL/min (by C-G formula based on SCr of 0.6 mg/dL). Liver & Pancreas: Recent Labs  Lab 03/15/23 2127 03/16/23 2159 03/17/23 1456 03/18/23 0353  AST 162* 132* 138* 111*  ALT 265* 281* 272* 259*  ALKPHOS 243*  275* 260* 252*  BILITOT 4.1* 5.7* 6.2* 5.4*  PROT 7.7 8.0 7.8 7.6  ALBUMIN 4.2 4.2 3.5 3.4*   Recent Labs  Lab 03/15/23 2127 03/16/23 2159  LIPASE <10* <10*   No results for input(s): "AMMONIA" in the last 168 hours. Diabetic: No results for input(s): "HGBA1C" in the last 72 hours. No results for input(s): "GLUCAP" in the last 168 hours. Cardiac Enzymes: No results for input(s): "CKTOTAL", "CKMB", "CKMBINDEX", "TROPONINI" in the last 168 hours. No results for input(s): "PROBNP" in the last 8760 hours. Coagulation Profile: No results for input(s): "INR", "PROTIME" in the last 168 hours. Thyroid Function Tests: No results for input(s): "TSH", "T4TOTAL", "FREET4", "T3FREE", "THYROIDAB" in the last 72 hours. Lipid Profile: No results for input(s): "CHOL", "HDL", "LDLCALC", "TRIG", "CHOLHDL", "LDLDIRECT" in the last 72 hours. Anemia Panel: No results for input(s): "VITAMINB12", "FOLATE", "FERRITIN", "TIBC", "IRON", "RETICCTPCT" in the last 72 hours. Urine analysis:    Component Value Date/Time   COLORURINE YELLOW 03/16/2023 2159   APPEARANCEUR HAZY (A) 03/16/2023 2159   LABSPEC 1.028 03/16/2023 2159   PHURINE 5.5 03/16/2023 2159   GLUCOSEU NEGATIVE 03/16/2023 2159   HGBUR SMALL (A) 03/16/2023 2159   BILIRUBINUR LARGE (A) 03/16/2023 2159   KETONESUR 40 (A) 03/16/2023 2159   PROTEINUR 30 (A) 03/16/2023 2159   NITRITE NEGATIVE 03/16/2023 2159   LEUKOCYTESUR SMALL (A) 03/16/2023 2159   Sepsis Labs: Invalid input(s): "PROCALCITONIN", "LACTICIDVEN"  Microbiology: Recent Results (from the past 240 hours)  Urine Culture     Status: Abnormal   Collection Time: 03/15/23 11:19 PM   Specimen: Urine, Clean Catch  Result Value Ref Range Status   Specimen Description   Final    URINE, CLEAN CATCH Performed at [facility], [address], [place], [place] [address]    Special Requests   Final    NONE Performed at [facility], [address], [place], [place] [address]    Culture (A)  Final    <10,000 COLONIES/mL INSIGNIFICANT GROWTH Performed at [facility], [address], [place], [place] [address]    Report Status 03/17/2023 FINAL  Final    Radiology Studies: MR ABDOMEN MRCP W WO CONTAST Result Date: 03/18/2023 CLINICAL DATA:  Right upper quadrant pain. Biliary disease suspected. EXAM: MRI ABDOMEN WITHOUT AND WITH CONTRAST (INCLUDING MRCP) TECHNIQUE: Multiplanar multisequence MR imaging of the abdomen was performed both before and after the administration of intravenous contrast. Heavily T2-weighted images of the biliary and pancreatic ducts were obtained, and three-dimensional MRCP images were rendered by post processing. CONTRAST:  10mL GADAVIST GADOBUTROL 1 MMOL/ML IV SOLN COMPARISON:  Right upper quadrant sonogram 03/17/2023 and CT AP 03/15/2023 FINDINGS: Lower chest: No acute abnormality. Hepatobiliary: Hepatic steatosis. Further decrease in size of atypical enhancing lesion within lateral segment of the left hepatic lobe on today's exam this measures 4.2 x 2.8 cm, image 38/18. On the prior exam this measured 4.3 x 3.6 cm (by my measurements.). 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 There is  a small nonenhancing T2 hypointense and T1 hypointense lesion within the periphery of the right lobe of liver measuring 5 mm. This does not show enhancement on the postcontrast images. Unchanged from 08/26/2021. Status post cholecystectomy. Normal caliber common bile duct measuring 4 mm in diameter. Focal dilatation of the distal common bile duct just before the ampulla measures 5 mm. There is a small stone scratch set here, there is a small stone measuring 3 mm, image 21/10. Pancreas: No mass, inflammatory changes, or other parenchymal abnormality identified. Spleen:  Within normal limits in size and appearance. Adrenals/Urinary Tract: Normal adrenal glands. No nephrolithiasis, hydronephrosis or suspicious  mass. Stomach/Bowel: No dilated bowel loops identified. No wall thickening or inflammatory changes identified. Vascular/Lymphatic: Patent upper abdominal vascularity. Normal caliber abdominal aorta. No adenopathy. Other:  No free fluid or fluid collection. Musculoskeletal: No suspicious bone lesions identified. IMPRESSION: 1. Status post cholecystectomy. 2. Focal dilatation of the distal common bile duct just before the ampulla measures 5 mm. There is a small stone measuring 3 mm within the distal common bile duct. Findings compatible with choledocholithiasis. 3. Slight decrease in size of atypical enhancing lesion within the lateral segment of the left hepatic lobe. 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 4. Hepatic steatosis. Electronically Signed   By: Signa Kell M.D.   On: 03/18/2023 07:37   MR 3D Recon At Scanner Result Date: 03/18/2023 CLINICAL DATA:  Right upper quadrant pain. Biliary disease suspected. EXAM: MRI ABDOMEN WITHOUT AND WITH CONTRAST (INCLUDING MRCP) TECHNIQUE: Multiplanar multisequence MR imaging of the abdomen was performed both before and after the administration of intravenous contrast. Heavily T2-weighted images of the biliary and pancreatic ducts were obtained, and three-dimensional MRCP images were rendered by post processing. CONTRAST:  10mL GADAVIST GADOBUTROL 1 MMOL/ML IV SOLN COMPARISON:  Right upper quadrant sonogram 03/17/2023 and CT AP 03/15/2023 FINDINGS: Lower chest: No acute abnormality. Hepatobiliary: Hepatic steatosis. Further decrease in size of atypical enhancing lesion within lateral segment of the left hepatic lobe on today's exam this measures 4.2 x 2.8 cm, image 38/18. On the prior exam this measured 4.3 x 3.6 cm (by my measurements.). 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 There is a small nonenhancing T2 hypointense and T1 hypointense lesion within the periphery of the right lobe of liver measuring 5 mm.  This does not show enhancement on the postcontrast images. Unchanged from 08/26/2021. Status post cholecystectomy. Normal caliber common bile duct measuring 4 mm in diameter. Focal dilatation of the distal common bile duct just before the ampulla measures 5 mm. There is a small stone scratch set here, there is a small stone measuring 3 mm, image 21/10. Pancreas: No mass, inflammatory changes, or other parenchymal abnormality identified. Spleen:  Within normal limits in size and appearance. Adrenals/Urinary Tract: Normal adrenal glands. No nephrolithiasis, hydronephrosis or suspicious mass. Stomach/Bowel: No dilated bowel loops identified. No wall thickening or inflammatory changes identified. Vascular/Lymphatic: Patent upper abdominal vascularity. Normal caliber abdominal aorta. No adenopathy. Other:  No free fluid or fluid collection. Musculoskeletal: No suspicious bone lesions identified. IMPRESSION: 1. Status post cholecystectomy. 2. Focal dilatation of the distal common bile duct just before the ampulla measures 5 mm. There is a small stone measuring 3 mm within the distal common bile duct. Findings compatible with choledocholithiasis. 3. Slight decrease in size of atypical enhancing lesion within the lateral segment of the left hepatic lobe. 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 4. Hepatic steatosis. Electronically Signed  By: Signa Kell M.D.   On: 03/18/2023 07:37      Clifton Kovacic T. Dorea Duff [facility]  If 7PM-7AM, please contact night-coverage [email or web address] 03/18/2023, 11:19 AM

## 2023-03-18 NOTE — Plan of Care (Signed)
  Problem: Education: Goal: Knowledge of General Education information will improve Description: Including pain rating scale, medication(s)/side effects and non-pharmacologic comfort measures Outcome: Progressing   Problem: Health Behavior/Discharge Planning: Goal: Ability to manage health-related needs will improve Outcome: Adequate for Discharge   Problem: Clinical Measurements: Goal: Ability to maintain clinical measurements within normal limits will improve Outcome: Progressing Goal: Will remain free from infection Outcome: Progressing Goal: Diagnostic test results will improve Outcome: Progressing Goal: Respiratory complications will improve Outcome: Progressing Goal: Cardiovascular complication will be avoided Outcome: Progressing   Problem: Activity: Goal: Risk for activity intolerance will decrease Outcome: Progressing   Problem: Nutrition: Goal: Adequate nutrition will be maintained Outcome: Progressing   Problem: Coping: Goal: Level of anxiety will decrease Outcome: Progressing   Problem: Elimination: Goal: Will not experience complications related to bowel motility Outcome: Progressing   Problem: Pain Management: Goal: General experience of comfort will improve Outcome: Progressing   Problem: Safety: Goal: Ability to remain free from injury will improve Outcome: Progressing   Problem: Skin Integrity: Goal: Risk for impaired skin integrity will decrease Outcome: Progressing

## 2023-03-19 DIAGNOSIS — R1011 Right upper quadrant pain: Secondary | ICD-10-CM

## 2023-03-19 DIAGNOSIS — R7401 Elevation of levels of liver transaminase levels: Secondary | ICD-10-CM

## 2023-03-19 DIAGNOSIS — R748 Abnormal levels of other serum enzymes: Secondary | ICD-10-CM | POA: Diagnosis not present

## 2023-03-19 DIAGNOSIS — R17 Unspecified jaundice: Secondary | ICD-10-CM | POA: Diagnosis not present

## 2023-03-19 DIAGNOSIS — E66812 Obesity, class 2: Secondary | ICD-10-CM

## 2023-03-19 LAB — COMPREHENSIVE METABOLIC PANEL
ALT: 252 U/L — ABNORMAL HIGH (ref 0–44)
AST: 115 U/L — ABNORMAL HIGH (ref 15–41)
Albumin: 3.5 g/dL (ref 3.5–5.0)
Alkaline Phosphatase: 251 U/L — ABNORMAL HIGH (ref 38–126)
Anion gap: 11 (ref 5–15)
BUN: 10 mg/dL (ref 6–20)
CO2: 25 mmol/L (ref 22–32)
Calcium: 9.2 mg/dL (ref 8.9–10.3)
Chloride: 102 mmol/L (ref 98–111)
Creatinine, Ser: 0.39 mg/dL — ABNORMAL LOW (ref 0.44–1.00)
GFR, Estimated: >60 mL/min (ref >60)
Glucose, Bld: 118 mg/dL — ABNORMAL HIGH (ref 70–99)
Potassium: 4 mmol/L (ref 3.5–5.1)
Sodium: 138 mmol/L (ref 135–145)
Total Bilirubin: 6.5 mg/dL — ABNORMAL HIGH (ref <1.2)
Total Protein: 7.5 g/dL (ref 6.5–8.1)

## 2023-03-19 LAB — CBC
HCT: 40.2 % (ref 36.0–46.0)
Hemoglobin: 12.2 g/dL (ref 12.0–15.0)
MCH: 24.7 pg — ABNORMAL LOW (ref 26.0–34.0)
MCHC: 30.3 g/dL (ref 30.0–36.0)
MCV: 81.4 fL (ref 80.0–100.0)
Platelets: 346 10*3/uL (ref 150–400)
RBC: 4.94 MIL/uL (ref 3.87–5.11)
RDW: 16 % — ABNORMAL HIGH (ref 11.5–15.5)
WBC: 11.9 10*3/uL — ABNORMAL HIGH (ref 4.0–10.5)
nRBC: 0 % (ref 0.0–0.2)

## 2023-03-19 LAB — MAGNESIUM: Magnesium: 2.2 mg/dL (ref 1.7–2.4)

## 2023-03-19 MED ORDER — HYDRALAZINE HCL 20 MG/ML IJ SOLN: 10.0000 mg | Freq: Four times a day (QID) | INTRAMUSCULAR | Status: DC | PRN

## 2023-03-19 NOTE — Plan of Care (Signed)
  Problem: Education: Goal: Knowledge of General Education information will improve Description: Including pain rating scale, medication(s)/side effects and non-pharmacologic comfort measures Outcome: Progressing   Problem: Health Behavior/Discharge Planning: Goal: Ability to manage health-related needs will improve Outcome: Progressing   Problem: Clinical Measurements: Goal: Ability to maintain clinical measurements within normal limits will improve Outcome: Progressing Goal: Will remain free from infection Outcome: Progressing Goal: Diagnostic test results will improve Outcome: Progressing Goal: Respiratory complications will improve Outcome: Progressing Goal: Cardiovascular complication will be avoided Outcome: Progressing   Problem: Activity: Goal: Risk for activity intolerance will decrease Outcome: Progressing   Problem: Nutrition: Goal: Adequate nutrition will be maintained Outcome: Progressing   Problem: Coping: Goal: Level of anxiety will decrease Outcome: Progressing   Problem: Elimination: Goal: Will not experience complications related to bowel motility Outcome: Progressing   Problem: Pain Management: Goal: General experience of comfort will improve Outcome: Progressing   Problem: Safety: Goal: Ability to remain free from injury will improve Outcome: Progressing   Problem: Skin Integrity: Goal: Risk for impaired skin integrity will decrease Outcome: Progressing

## 2023-03-19 NOTE — Progress Notes (Signed)
   03/19/23 1011  TOC Brief Assessment  Insurance and Status Reviewed  Patient has primary care physician Yes  Home environment has been reviewed home alone  Prior level of function: independent  Prior/Current Home Services No current home services  Social Drivers of Health Review SDOH reviewed no interventions necessary  Readmission risk has been reviewed Yes  Transition of care needs no transition of care needs at this time

## 2023-03-19 NOTE — H&P (View-Only) (Signed)
[facility] Gastroenterology Progress Note  Amanda [name] 27 y.o. 1995-08-07  CC: Abdominal pain, abnormal LFTs, choledocholithiasis   Subjective: Patient seen and examined at bedside.  Continues to have abdominal pain.  Denies any nausea or vomiting.  ROS : afebrile, negative for diarrhea or constipation.   Objective: Vital signs in last 24 hours: Vitals:   03/18/23 2148 03/19/23 0542  BP: (!) 183/89 (!) 158/100  Pulse: 76 82  Resp: 17 18  Temp: 98 F (36.7 C) 98.1 F (36.7 C)  SpO2: 97% 96%    Physical Exam:  General:  Alert, cooperative, no distress, appears stated age  Head:  Normocephalic, without obvious abnormality, atraumatic  Eyes:  , EOM's intact,   Lungs:   Clear to auscultation bilaterally, respirations unlabored  Heart:  Regular rate and rhythm, S1, S2 normal  Abdomen:   Soft, epigastric tenderness to palpation,, bowel sounds active all four quadrants,  no masses,   Extremities: Extremities normal, atraumatic, no  edema  Pulses: 2+ and symmetric    Lab Results: Recent Labs    03/18/23 0353 03/19/23 0350  NA 137 138  K 3.9 4.0  CL 100 102  CO2 24 25  GLUCOSE 119* 118*  BUN 11 10  CREATININE 0.60 0.39*  CALCIUM 9.0 9.2  MG  --  2.2   Recent Labs    03/18/23 0353 03/19/23 0350  AST 111* 115*  ALT 259* 252*  ALKPHOS 252* 251*  BILITOT 5.4* 6.5*  PROT 7.6 7.5  ALBUMIN 3.4* 3.5   Recent Labs    03/18/23 0353 03/19/23 0350  WBC 10.9* 11.9*  HGB 12.4 12.2  HCT 40.3 40.2  MCV 80.3 81.4  PLT 355 346   No results for input(s): "LABPROT", "INR" in the last 72 hours.    Assessment/Plan: -Choledocholithiasis with MRI MRCP confirming distal CBD stone.  S/p cholecystectomy. -Abnormal LFTs and abdominal pain.  Likely from above. -Atypical liver hemangioma.  Recommendations ------------------------ -Tentative plan for ERCP with Dr. Russella Dar tomorrow afternoon.  Okay to have clear liquids diet today.  Keep n.p.o. past midnight.  Continue  antibiotics for now.  -Appreciate  GI help with this case.  Risks (post ERCP pancreatitis, bleeding, infection, bowel perforation that could require surgery, sedation-related changes in cardiopulmonary systems), benefits (identification and possible treatment of source of symptoms, exclusion of certain causes of symptoms), and alternatives (watchful waiting, radiographic imaging studies, empiric medical treatment)  were explained to patient/family in detail and patient wishes to proceed.    Kathi Der MD, FACP 03/19/2023, 10:35 AM  Contact #  [phone]

## 2023-03-19 NOTE — Progress Notes (Signed)
[facility]                                                                              [place], is a 27 y.o. female, DOB - 06/27/1995, XHB:[id number] Admit date - 03/17/2023    Outpatient Primary MD for the patient is Camie Patience, FNP  LOS - 1  days  Chief Complaint  Patient presents with   Abdominal Pain       Brief summary   Patient is a 27 year old female with obesity and prior lap chole in 2023 presenting with upper abdominal pain, jaundice and elevated LFT.  CT abdomen and pelvis and RUQ [place] without significant finding.  GI consulted and recommended MRCP that showed choledocholithiasis.     Assessment & Plan    Principal Problem: Acute transaminitis with hyperbilirubinemia due to choledocholithiasis -CT abdomen pelvis, RUQ [place] unrevealing -MRCP showed s/p postcholecystectomy, focal dilation of distal CBD before the ampulla measuring 5 mm, small stones measuring 3 mm in the distal CBD, compatible with choledocholithiasis. -Mild abdominal pain, no acute fevers, continue IV ceftriaxone, Flagyl -GI following, n.p.o. plan for ERCP today  Acute transaminitis with hyperbilirubinemia -Hepatitis panel negative, HIV nonreactive -Plan for ERCP today    Leukocytosis -likely reactive but concern for cholangitis -For now continue IV antibiotics   BP readings elevated -No prior history of hypertension, possibly due to anxiety and pain -Placed on IV hydralazine as needed with parameters  Class II obesity Estimated body mass index is 39.85 kg/m as calculated from the following:   Height as of this encounter: 5\' 6"  (1.676 m).   Weight as of this encounter: 112 kg.  Code Status: Full CODE STATUS DVT Prophylaxis:  Place and maintain sequential compression device Start: 03/18/23 0747 SCDs Start: 03/17/23 1646   Level of Care: Level of care: Med-Surg Family Communication: Updated patient's father at the bedside Disposition Plan:      Remains  inpatient appropriate: Plan for ERCP today   Procedures:  MRCP  Consultants:   GI  Antimicrobials:   Anti-infectives (From admission, onward)    Start     Dose/Rate Route Frequency Ordered Stop   03/18/23 1200  cefTRIAXone (ROCEPHIN) 2 g in sodium chloride 0.9 % 100 mL IVPB        2 g 200 mL/hr over 30 Minutes Intravenous Daily 03/18/23 1121     03/18/23 1200  metroNIDAZOLE (FLAGYL) IVPB 500 mg        500 mg 100 mL/hr over 60 Minutes Intravenous Every 12 hours 03/18/23 1121            Medications     Subjective:   Amanda [name] was seen and examined today.  Still has some right-sided abdominal pain, no acute nausea vomiting, fevers or chills.  Awaiting ERCP today.  BP readings elevated   Objective:   Vitals:   03/18/23 0509 03/18/23 1335 03/18/23 2148 03/19/23 0542  BP: (!) 146/84 138/81 (!) 183/89 (!) 158/100  Pulse: 76 78 76 82  Resp: 16 17 17 18   Temp: 98.7 F (37.1 C) 97.6 F (36.4 C) 98 F (36.7 C) 98.1 F (36.7 C)  TempSrc: Oral  Oral Oral  SpO2: 97% 98% 97% 96%  Weight:      Height:        Intake/Output Summary (Last 24 hours) at 03/19/2023 1312 Last data filed at 03/19/2023 0100 Gross per 24 hour  Intake 911.1 ml  Output --  Net 911.1 ml     Wt Readings from Last 3 Encounters:  03/17/23 112 kg  03/15/23 112 kg  08/26/21 112 kg     Exam General: Alert and oriented x 3, NAD Cardiovascular: S1 S2 auscultated,  RRR Respiratory: Clear to auscultation bilaterally, no wheezing Gastrointestinal: Soft, mild RUQ TTP, ND, NBS  Ext: no pedal edema bilaterally Neuro: Strength 5/5 upper and lower extremities bilaterally Psych: Normal affect     Data Reviewed:  I have personally reviewed following labs    CBC Lab Results  Component Value Date   WBC 11.9 (H) 03/19/2023   RBC 4.94 03/19/2023   HGB 12.2 03/19/2023   HCT 40.2 03/19/2023   MCV 81.4 03/19/2023   MCH 24.7 (L) 03/19/2023   PLT 346 03/19/2023   MCHC 30.3 03/19/2023   RDW  16.0 (H) 03/19/2023     Last metabolic panel Lab Results  Component Value Date   NA 138 03/19/2023   K 4.0 03/19/2023   CL 102 03/19/2023   CO2 25 03/19/2023   BUN 10 03/19/2023   CREATININE 0.39 (L) 03/19/2023   GLUCOSE 118 (H) 03/19/2023   GFRNONAA >60 03/19/2023   CALCIUM 9.2 03/19/2023   PROT 7.5 03/19/2023   ALBUMIN 3.5 03/19/2023   BILITOT 6.5 (H) 03/19/2023   ALKPHOS 251 (H) 03/19/2023   AST 115 (H) 03/19/2023   ALT 252 (H) 03/19/2023   ANIONGAP 11 03/19/2023    CBG (last 3)  No results for input(s): "GLUCAP" in the last 72 hours.    Coagulation Profile: No results for input(s): "INR", "PROTIME" in the last 168 hours.   Radiology Studies: I have personally reviewed the imaging studies  MR ABDOMEN MRCP W WO CONTAST Result Date: 03/18/2023 CLINICAL DATA:  Right upper quadrant pain. Biliary disease suspected. EXAM: MRI ABDOMEN WITHOUT AND WITH CONTRAST (INCLUDING MRCP) TECHNIQUE: Multiplanar multisequence MR imaging of the abdomen was performed both before and after the administration of intravenous contrast. Heavily T2-weighted images of the biliary and pancreatic ducts were obtained, and three-dimensional MRCP images were rendered by post processing. CONTRAST:  10mL GADAVIST GADOBUTROL 1 MMOL/ML IV SOLN COMPARISON:  Right upper quadrant sonogram 03/17/2023 and CT AP 03/15/2023 FINDINGS: Lower chest: No acute abnormality. Hepatobiliary: Hepatic steatosis. Further decrease in size of atypical enhancing lesion within lateral segment of the left hepatic lobe on today's exam this measures 4.2 x 2.8 cm, image 38/18. On the prior exam this measured 4.3 x 3.6 cm (by my measurements.). 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 There is a small nonenhancing T2 hypointense and T1 hypointense lesion within the periphery of the right lobe of liver measuring 5 mm. This does not show enhancement on the postcontrast images. Unchanged from 08/26/2021. Status post  cholecystectomy. Normal caliber common bile duct measuring 4 mm in diameter. Focal dilatation of the distal common bile duct just before the ampulla measures 5 mm. There is a small stone scratch set here, there is a small stone measuring 3 mm, image 21/10. Pancreas: No mass, inflammatory changes, or other parenchymal abnormality identified. Spleen:  Within normal limits in size and appearance. Adrenals/Urinary Tract: Normal adrenal glands. No nephrolithiasis, hydronephrosis or suspicious mass. Stomach/Bowel:  No dilated bowel loops identified. No wall thickening or inflammatory changes identified. Vascular/Lymphatic: Patent upper abdominal vascularity. Normal caliber abdominal aorta. No adenopathy. Other:  No free fluid or fluid collection. Musculoskeletal: No suspicious bone lesions identified. IMPRESSION: 1. Status post cholecystectomy. 2. Focal dilatation of the distal common bile duct just before the ampulla measures 5 mm. There is a small stone measuring 3 mm within the distal common bile duct. Findings compatible with choledocholithiasis. 3. Slight decrease in size of atypical enhancing lesion within the lateral segment of the left hepatic lobe. 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 4. Hepatic steatosis. Electronically Signed   By: Signa Kell M.D.   On: 03/18/2023 07:37   MR 3D Recon At Scanner Result Date: 03/18/2023 CLINICAL DATA:  Right upper quadrant pain. Biliary disease suspected. EXAM: MRI ABDOMEN WITHOUT AND WITH CONTRAST (INCLUDING MRCP) TECHNIQUE: Multiplanar multisequence MR imaging of the abdomen was performed both before and after the administration of intravenous contrast. Heavily T2-weighted images of the biliary and pancreatic ducts were obtained, and three-dimensional MRCP images were rendered by post processing. CONTRAST:  10mL GADAVIST GADOBUTROL 1 MMOL/ML IV SOLN COMPARISON:  Right upper quadrant sonogram 03/17/2023 and CT AP 03/15/2023 FINDINGS: Lower  chest: No acute abnormality. Hepatobiliary: Hepatic steatosis. Further decrease in size of atypical enhancing lesion within lateral segment of the left hepatic lobe on today's exam this measures 4.2 x 2.8 cm, image 38/18. On the prior exam this measured 4.3 x 3.6 cm (by my measurements.). 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 There is a small nonenhancing T2 hypointense and T1 hypointense lesion within the periphery of the right lobe of liver measuring 5 mm. This does not show enhancement on the postcontrast images. Unchanged from 08/26/2021. Status post cholecystectomy. Normal caliber common bile duct measuring 4 mm in diameter. Focal dilatation of the distal common bile duct just before the ampulla measures 5 mm. There is a small stone scratch set here, there is a small stone measuring 3 mm, image 21/10. Pancreas: No mass, inflammatory changes, or other parenchymal abnormality identified. Spleen:  Within normal limits in size and appearance. Adrenals/Urinary Tract: Normal adrenal glands. No nephrolithiasis, hydronephrosis or suspicious mass. Stomach/Bowel: No dilated bowel loops identified. No wall thickening or inflammatory changes identified. Vascular/Lymphatic: Patent upper abdominal vascularity. Normal caliber abdominal aorta. No adenopathy. Other:  No free fluid or fluid collection. Musculoskeletal: No suspicious bone lesions identified. IMPRESSION: 1. Status post cholecystectomy. 2. Focal dilatation of the distal common bile duct just before the ampulla measures 5 mm. There is a small stone measuring 3 mm within the distal common bile duct. Findings compatible with choledocholithiasis. 3. Slight decrease in size of atypical enhancing lesion within the lateral segment of the left hepatic lobe. 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 4. Hepatic steatosis. Electronically Signed   By: Signa Kell M.D.   On: 03/18/2023 07:37       Tavaughn Silguero  M.D. [facility] 03/19/2023, 1:12 PM  Available via Epic secure chat 7am-7pm After 7 pm, please refer to night coverage provider listed on amion.

## 2023-03-19 NOTE — Progress Notes (Signed)
[facility] Gastroenterology Progress Note  Amanda [name] 27 y.o. 1995-08-07  CC: Abdominal pain, abnormal LFTs, choledocholithiasis   Subjective: Patient seen and examined at bedside.  Continues to have abdominal pain.  Denies any nausea or vomiting.  ROS : afebrile, negative for diarrhea or constipation.   Objective: Vital signs in last 24 hours: Vitals:   03/18/23 2148 03/19/23 0542  BP: (!) 183/89 (!) 158/100  Pulse: 76 82  Resp: 17 18  Temp: 98 F (36.7 C) 98.1 F (36.7 C)  SpO2: 97% 96%    Physical Exam:  General:  Alert, cooperative, no distress, appears stated age  Head:  Normocephalic, without obvious abnormality, atraumatic  Eyes:  , EOM's intact,   Lungs:   Clear to auscultation bilaterally, respirations unlabored  Heart:  Regular rate and rhythm, S1, S2 normal  Abdomen:   Soft, epigastric tenderness to palpation,, bowel sounds active all four quadrants,  no masses,   Extremities: Extremities normal, atraumatic, no  edema  Pulses: 2+ and symmetric    Lab Results: Recent Labs    03/18/23 0353 03/19/23 0350  NA 137 138  K 3.9 4.0  CL 100 102  CO2 24 25  GLUCOSE 119* 118*  BUN 11 10  CREATININE 0.60 0.39*  CALCIUM 9.0 9.2  MG  --  2.2   Recent Labs    03/18/23 0353 03/19/23 0350  AST 111* 115*  ALT 259* 252*  ALKPHOS 252* 251*  BILITOT 5.4* 6.5*  PROT 7.6 7.5  ALBUMIN 3.4* 3.5   Recent Labs    03/18/23 0353 03/19/23 0350  WBC 10.9* 11.9*  HGB 12.4 12.2  HCT 40.3 40.2  MCV 80.3 81.4  PLT 355 346   No results for input(s): "LABPROT", "INR" in the last 72 hours.    Assessment/Plan: -Choledocholithiasis with MRI MRCP confirming distal CBD stone.  S/p cholecystectomy. -Abnormal LFTs and abdominal pain.  Likely from above. -Atypical liver hemangioma.  Recommendations ------------------------ -Tentative plan for ERCP with Dr. Russella Dar tomorrow afternoon.  Okay to have clear liquids diet today.  Keep n.p.o. past midnight.  Continue  antibiotics for now.  -Appreciate  GI help with this case.  Risks (post ERCP pancreatitis, bleeding, infection, bowel perforation that could require surgery, sedation-related changes in cardiopulmonary systems), benefits (identification and possible treatment of source of symptoms, exclusion of certain causes of symptoms), and alternatives (watchful waiting, radiographic imaging studies, empiric medical treatment)  were explained to patient/family in detail and patient wishes to proceed.    Kathi Der MD, FACP 03/19/2023, 10:35 AM  Contact #  [phone]

## 2023-03-20 ENCOUNTER — Encounter (HOSPITAL_COMMUNITY)
Admission: EM | Disposition: A | Discharge status: Home / Self Care | Payer: Self-pay | Source: Home / Self Care | Attending: Internal Medicine

## 2023-03-20 ENCOUNTER — Inpatient Hospital Stay (HOSPITAL_COMMUNITY): Payer: BC Managed Care – PPO | Admitting: Anesthesiology

## 2023-03-20 ENCOUNTER — Inpatient Hospital Stay (HOSPITAL_COMMUNITY): Payer: BC Managed Care – PPO

## 2023-03-20 ENCOUNTER — Encounter (HOSPITAL_COMMUNITY): Payer: Self-pay | Admitting: Student

## 2023-03-20 DIAGNOSIS — R7989 Other specified abnormal findings of blood chemistry: Secondary | ICD-10-CM

## 2023-03-20 DIAGNOSIS — K8050 Calculus of bile duct without cholangitis or cholecystitis without obstruction: Secondary | ICD-10-CM | POA: Diagnosis not present

## 2023-03-20 DIAGNOSIS — Z9049 Acquired absence of other specified parts of digestive tract: Secondary | ICD-10-CM

## 2023-03-20 DIAGNOSIS — R7401 Elevation of levels of liver transaminase levels: Secondary | ICD-10-CM | POA: Diagnosis not present

## 2023-03-20 DIAGNOSIS — R1011 Right upper quadrant pain: Secondary | ICD-10-CM | POA: Diagnosis not present

## 2023-03-20 DIAGNOSIS — R933 Abnormal findings on diagnostic imaging of other parts of digestive tract: Secondary | ICD-10-CM

## 2023-03-20 HISTORY — PX: SPHINCTEROTOMY: SHX5544

## 2023-03-20 HISTORY — PX: ERCP: SHX5425

## 2023-03-20 HISTORY — PX: REMOVAL OF STONES: SHX5545

## 2023-03-20 LAB — CBC
HCT: 39.8 % (ref 36.0–46.0)
Hemoglobin: 12.4 g/dL (ref 12.0–15.0)
MCH: 24.8 pg — ABNORMAL LOW (ref 26.0–34.0)
MCHC: 31.2 g/dL (ref 30.0–36.0)
MCV: 79.6 fL — ABNORMAL LOW (ref 80.0–100.0)
Platelets: 334 10*3/uL (ref 150–400)
RBC: 5 MIL/uL (ref 3.87–5.11)
RDW: 16 % — ABNORMAL HIGH (ref 11.5–15.5)
WBC: 11.2 10*3/uL — ABNORMAL HIGH (ref 4.0–10.5)
nRBC: 0 % (ref 0.0–0.2)

## 2023-03-20 LAB — COMPREHENSIVE METABOLIC PANEL
ALT: 232 U/L — ABNORMAL HIGH (ref 0–44)
AST: 103 U/L — ABNORMAL HIGH (ref 15–41)
Albumin: 3.4 g/dL — ABNORMAL LOW (ref 3.5–5.0)
Alkaline Phosphatase: 244 U/L — ABNORMAL HIGH (ref 38–126)
Anion gap: 11 (ref 5–15)
BUN: 11 mg/dL (ref 6–20)
CO2: 26 mmol/L (ref 22–32)
Calcium: 9.2 mg/dL (ref 8.9–10.3)
Chloride: 100 mmol/L (ref 98–111)
Creatinine, Ser: 0.38 mg/dL — ABNORMAL LOW (ref 0.44–1.00)
GFR, Estimated: >60 mL/min (ref >60)
Glucose, Bld: 113 mg/dL — ABNORMAL HIGH (ref 70–99)
Potassium: 4 mmol/L (ref 3.5–5.1)
Sodium: 137 mmol/L (ref 135–145)
Total Bilirubin: 7.2 mg/dL — ABNORMAL HIGH (ref <1.2)
Total Protein: 7.5 g/dL (ref 6.5–8.1)

## 2023-03-20 SURGERY — ERCP, WITH INTERVENTION IF INDICATED
Anesthesia: General

## 2023-03-20 MED ORDER — DEXAMETHASONE SODIUM PHOSPHATE 4 MG/ML IJ SOLN
INTRAMUSCULAR | Status: DC | PRN
  Administered 2023-03-20: 5 mg via INTRAVENOUS

## 2023-03-20 MED ORDER — INDOMETHACIN 50 MG RE SUPP
RECTAL | Status: DC | PRN
  Administered 2023-03-20: 100 mg via RECTAL

## 2023-03-20 MED ORDER — MIDAZOLAM HCL 2 MG/2ML IJ SOLN
INTRAMUSCULAR | Status: AC
Start: 2023-03-20 — End: ?
  Filled 2023-03-20: qty 2

## 2023-03-20 MED ORDER — GLUCAGON HCL RDNA (DIAGNOSTIC) 1 MG IJ SOLR
INTRAMUSCULAR | Status: AC
  Filled 2023-03-20: qty 1

## 2023-03-20 MED ORDER — SUGAMMADEX SODIUM 200 MG/2ML IV SOLN
INTRAVENOUS | Status: DC | PRN
  Administered 2023-03-20: 200 mg via INTRAVENOUS

## 2023-03-20 MED ORDER — MIDAZOLAM HCL 5 MG/5ML IJ SOLN
INTRAMUSCULAR | Status: DC | PRN
  Administered 2023-03-20: 2 mg via INTRAVENOUS

## 2023-03-20 MED ORDER — ROCURONIUM BROMIDE 100 MG/10ML IV SOLN
INTRAVENOUS | Status: DC | PRN
  Administered 2023-03-20: 50 mg via INTRAVENOUS

## 2023-03-20 MED ORDER — LACTATED RINGERS IV SOLN: INTRAVENOUS | Status: DC | PRN

## 2023-03-20 MED ORDER — FENTANYL CITRATE (PF) 100 MCG/2ML IJ SOLN: 25.0000 ug | INTRAMUSCULAR | Status: DC | PRN

## 2023-03-20 MED ORDER — OXYCODONE HCL 5 MG PO TABS: 5.0000 mg | ORAL_TABLET | Freq: Four times a day (QID) | ORAL | 0 refills | Status: AC | PRN

## 2023-03-20 MED ORDER — OXYCODONE HCL 5 MG/5ML PO SOLN: 5.0000 mg | Freq: Once | ORAL | Status: DC | PRN

## 2023-03-20 MED ORDER — FENTANYL CITRATE (PF) 100 MCG/2ML IJ SOLN
INTRAMUSCULAR | Status: AC
  Filled 2023-03-20: qty 2

## 2023-03-20 MED ORDER — CIPROFLOXACIN IN D5W 400 MG/200ML IV SOLN
INTRAVENOUS | Status: AC
  Filled 2023-03-20: qty 200

## 2023-03-20 MED ORDER — SODIUM CHLORIDE 0.9 % IV SOLN
INTRAVENOUS | Status: DC | PRN
  Administered 2023-03-20: 25 mL

## 2023-03-20 MED ORDER — ONDANSETRON HCL 4 MG/2ML IJ SOLN: 4.0000 mg | Freq: Four times a day (QID) | INTRAMUSCULAR | Status: DC | PRN

## 2023-03-20 MED ORDER — ONDANSETRON HCL 4 MG/2ML IJ SOLN
INTRAMUSCULAR | Status: DC | PRN
  Administered 2023-03-20: 4 mg via INTRAVENOUS

## 2023-03-20 MED ORDER — INDOMETHACIN 50 MG RE SUPP
RECTAL | Status: AC
  Filled 2023-03-20: qty 2

## 2023-03-20 MED ORDER — PROPOFOL 10 MG/ML IV BOLUS
INTRAVENOUS | Status: DC | PRN
  Administered 2023-03-20: 200 mg via INTRAVENOUS

## 2023-03-20 MED ORDER — GLUCAGON HCL RDNA (DIAGNOSTIC) 1 MG IJ SOLR
INTRAMUSCULAR | Status: DC | PRN
  Administered 2023-03-20 (×3): .25 mg via INTRAVENOUS

## 2023-03-20 MED ORDER — DICLOFENAC SUPPOSITORY 100 MG
RECTAL | Status: AC
  Filled 2023-03-20: qty 1

## 2023-03-20 MED ORDER — OXYCODONE HCL 5 MG PO TABS: 5.0000 mg | ORAL_TABLET | Freq: Once | ORAL | Status: DC | PRN

## 2023-03-20 MED ORDER — ONDANSETRON 4 MG PO TBDP: 4.0000 mg | ORAL_TABLET | Freq: Three times a day (TID) | ORAL | 0 refills | Status: AC | PRN

## 2023-03-20 MED ORDER — LIDOCAINE HCL (CARDIAC) PF 100 MG/5ML IV SOSY
PREFILLED_SYRINGE | INTRAVENOUS | Status: DC | PRN
  Administered 2023-03-20: 80 mg via INTRAVENOUS

## 2023-03-20 MED ORDER — FENTANYL CITRATE (PF) 100 MCG/2ML IJ SOLN
INTRAMUSCULAR | Status: DC | PRN
  Administered 2023-03-20 (×2): 50 ug via INTRAVENOUS

## 2023-03-20 NOTE — Anesthesia Procedure Notes (Signed)
Procedure Name: Intubation Date/Time: 03/20/2023 1:25 PM  Performed by: Deri Fuelling, CRNAPre-anesthesia Checklist: Patient identified, Emergency Drugs available, Suction available and Patient being monitored Patient Re-evaluated:Patient Re-evaluated prior to induction Oxygen Delivery Method: Circle system utilized Preoxygenation: Pre-oxygenation with 100% oxygen Induction Type: IV induction Ventilation: Mask ventilation without difficulty Laryngoscope Size: Glidescope and 3 Grade View: Grade I Tube type: Oral Tube size: 7.5 mm Number of attempts: 1 Airway Equipment and Method: Stylet and Oral airway Placement Confirmation: ETT inserted through vocal cords under direct vision, positive ETCO2 and breath sounds checked- equal and bilateral Secured at: 22 cm Tube secured with: Tape Dental Injury: Teeth and Oropharynx as per pre-operative assessment

## 2023-03-20 NOTE — Discharge Summary (Signed)
Physician Discharge Summary   Patient: Amanda [name] MRN: [id number] DOB: [birth year]  Admit date:     03/17/2023  Discharge date: 03/20/23  Discharge Physician: Thad Ranger, MD   PCP: Camie Patience, FNP   Recommendations at discharge:   Outpatient follow-up with GI for repeat LFTs in 1 week Patient will discharge home today if tolerated soft diet for supper Follow BP outpatient  Discharge Diagnoses:  Choledocholithiasis status post ERCP Acute transaminitis with hyperbilirubinemia   Obesity, Class II, BMI 35-39.9   Hepatic steatosis   Choledocholithiasis    Hospital Course:  Patient is a 27 year old female with obesity and prior lap chole in 2023 presenting with upper abdominal pain, jaundice and elevated LFT.  CT abdomen and pelvis and RUQ [place] without significant finding.  GI consulted and recommended MRCP that showed choledocholithiasis.    Assessment and Plan:  Acute transaminitis with hyperbilirubinemia due to choledocholithiasis -CT abdomen pelvis, RUQ [place] unrevealing -MRCP showed s/p postcholecystectomy, focal dilation of distal CBD before the ampulla measuring 5 mm, small stones measuring 3 mm in the distal CBD, compatible with choledocholithiasis. -Patient was placed on IV ceftriaxone and Flagyl -Underwent ERCP today, showed filling defect consistent with a stone, history of prior cholecystectomy.  Choledocholithiasis was found with complete removal and accomplished by biliary sphincterotomy and balloon extraction. -Per GI, clear liquid diet for 2 hours then advance diet to low-fat soft diet.  If patient is able to tolerate, discharge today pm   Acute transaminitis with hyperbilirubinemia -Secondary to acute choledocholithiasis, status post ERCP     Leukocytosis -likely reactive -No fevers or chills, patient received IV ceftriaxone and Flagyl while inpatient status post ERCP and stone removal.  Does not need any further antibiotics   BP readings elevated -No  prior history of hypertension, possibly due to anxiety and pain -Outpatient follow-up with PCP  Class II obesity Estimated body mass index is 39.85 kg/m as calculated from the following:   Height as of this encounter: 5\' 6"  (1.676 m).   Weight as of this encounter: 112 kg.       Pain control - [employer] Controlled Substance Reporting System database was reviewed. and patient was instructed, not to drive, operate heavy machinery, perform activities at heights, swimming or participation in water activities or provide baby-sitting services while on Pain, Sleep and Anxiety Medications; until their outpatient Physician has advised to do so again. Also recommended to not to take more than prescribed Pain, Sleep and Anxiety Medications.  Consultants: Gastroenterology Procedures performed: MRCP, ERCP Disposition: Home Diet recommendation:  Discharge Diet Orders (From admission, onward)     Start     Ordered   03/20/23 0000  Diet - low sodium heart healthy        03/20/23 1522            DISCHARGE MEDICATION: Allergies as of 03/20/2023   No Known Allergies      Medication List     STOP taking these medications    acetaminophen 500 MG tablet Commonly known as: TYLENOL       TAKE these medications    ondansetron 4 MG disintegrating tablet Commonly known as: ZOFRAN-ODT Take 1 tablet (4 mg total) by mouth every 8 (eight) hours as needed for nausea or vomiting.   oxyCODONE 5 MG immediate release tablet Commonly known as: Oxy IR/ROXICODONE Take 1 tablet (5 mg total) by mouth every 6 (six) hours as needed for moderate pain (pain score 4-6) or severe pain (pain score  7-10).   Sprintec 28 0.25-35 MG-MCG tablet Generic drug: norgestimate-ethinyl estradiol Take 1 tablet by mouth daily.        Follow-up Information     Willis Modena, MD. Schedule an appointment as soon as possible for a visit in 2 week(s).   Specialty: Gastroenterology Why: for hospital  follow-up, obtain labs LFT's Contact information: [address] [place] [address] [phone]         Camie Patience, FNP. Schedule an appointment as soon as possible for a visit in 2 week(s).   Specialty: Family Medicine Why: for hospital follow-up, obtain labs LFT's Contact information: [address] [place] [address] [phone]                Discharge Exam: Ceasar Mons Weights   03/17/23 1039 03/20/23 1213  Weight: 112 kg 115.2 kg   S:  feeling better, no acute complaints.  Plan to discharge home today after supper if able to tolerate solid diet  BP (!) 158/88 (BP Location: Right Arm)   Pulse 77   Temp 98.9 F (37.2 C) (Temporal)   Resp 20   Ht 5\' 6"  (1.676 m)   Wt 115.2 kg   LMP 03/20/2023 (Exact Date)   SpO2 93%   BMI 41.00 kg/m   Physical Exam General: Alert and oriented x 3, NAD Cardiovascular: S1 S2 clear, RRR.  Respiratory: CTAB, no wheezing Gastrointestinal: Soft, nontender, nondistended, NBS Ext: no pedal edema bilaterally Neuro: no new deficits Psych: Normal affect    Condition at discharge: fair  The results of significant diagnostics from this hospitalization (including imaging, microbiology, ancillary and laboratory) are listed below for reference.   Imaging Studies: MR ABDOMEN MRCP W WO CONTAST Result Date: 03/18/2023 CLINICAL DATA:  Right upper quadrant pain. Biliary disease suspected. EXAM: MRI ABDOMEN WITHOUT AND WITH CONTRAST (INCLUDING MRCP) TECHNIQUE: Multiplanar multisequence MR imaging of the abdomen was performed both before and after the administration of intravenous contrast. Heavily T2-weighted images of the biliary and pancreatic ducts were obtained, and three-dimensional MRCP images were rendered by post processing. CONTRAST:  10mL GADAVIST GADOBUTROL 1 MMOL/ML IV SOLN COMPARISON:  Right upper quadrant sonogram 03/17/2023 and CT AP 03/15/2023 FINDINGS: Lower chest: No acute abnormality.  Hepatobiliary: Hepatic steatosis. Further decrease in size of atypical enhancing lesion within lateral segment of the left hepatic lobe on today's exam this measures 4.2 x 2.8 cm, image 38/18. On the prior exam this measured 4.3 x 3.6 cm (by my measurements.). 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 There is a small nonenhancing T2 hypointense and T1 hypointense lesion within the periphery of the right lobe of liver measuring 5 mm. This does not show enhancement on the postcontrast images. Unchanged from 08/26/2021. Status post cholecystectomy. Normal caliber common bile duct measuring 4 mm in diameter. Focal dilatation of the distal common bile duct just before the ampulla measures 5 mm. There is a small stone scratch set here, there is a small stone measuring 3 mm, image 21/10. Pancreas: No mass, inflammatory changes, or other parenchymal abnormality identified. Spleen:  Within normal limits in size and appearance. Adrenals/Urinary Tract: Normal adrenal glands. No nephrolithiasis, hydronephrosis or suspicious mass. Stomach/Bowel: No dilated bowel loops identified. No wall thickening or inflammatory changes identified. Vascular/Lymphatic: Patent upper abdominal vascularity. Normal caliber abdominal aorta. No adenopathy. Other:  No free fluid or fluid collection. Musculoskeletal: No suspicious bone lesions identified. IMPRESSION: 1. Status post cholecystectomy. 2. Focal dilatation of the distal  common bile duct just before the ampulla measures 5 mm. There is a small stone measuring 3 mm within the distal common bile duct. Findings compatible with choledocholithiasis. 3. Slight decrease in size of atypical enhancing lesion within the lateral segment of the left hepatic lobe. 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 4. Hepatic steatosis. Electronically Signed   By: Signa Kell M.D.   On: 03/18/2023 07:37   MR 3D Recon At Scanner Result Date:  03/18/2023 CLINICAL DATA:  Right upper quadrant pain. Biliary disease suspected. EXAM: MRI ABDOMEN WITHOUT AND WITH CONTRAST (INCLUDING MRCP) TECHNIQUE: Multiplanar multisequence MR imaging of the abdomen was performed both before and after the administration of intravenous contrast. Heavily T2-weighted images of the biliary and pancreatic ducts were obtained, and three-dimensional MRCP images were rendered by post processing. CONTRAST:  10mL GADAVIST GADOBUTROL 1 MMOL/ML IV SOLN COMPARISON:  Right upper quadrant sonogram 03/17/2023 and CT AP 03/15/2023 FINDINGS: Lower chest: No acute abnormality. Hepatobiliary: Hepatic steatosis. Further decrease in size of atypical enhancing lesion within lateral segment of the left hepatic lobe on today's exam this measures 4.2 x 2.8 cm, image 38/18. On the prior exam this measured 4.3 x 3.6 cm (by my measurements.). 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 There is a small nonenhancing T2 hypointense and T1 hypointense lesion within the periphery of the right lobe of liver measuring 5 mm. This does not show enhancement on the postcontrast images. Unchanged from 08/26/2021. Status post cholecystectomy. Normal caliber common bile duct measuring 4 mm in diameter. Focal dilatation of the distal common bile duct just before the ampulla measures 5 mm. There is a small stone scratch set here, there is a small stone measuring 3 mm, image 21/10. Pancreas: No mass, inflammatory changes, or other parenchymal abnormality identified. Spleen:  Within normal limits in size and appearance. Adrenals/Urinary Tract: Normal adrenal glands. No nephrolithiasis, hydronephrosis or suspicious mass. Stomach/Bowel: No dilated bowel loops identified. No wall thickening or inflammatory changes identified. Vascular/Lymphatic: Patent upper abdominal vascularity. Normal caliber abdominal aorta. No adenopathy. Other:  No free fluid or fluid collection. Musculoskeletal: No suspicious  bone lesions identified. IMPRESSION: 1. Status post cholecystectomy. 2. Focal dilatation of the distal common bile duct just before the ampulla measures 5 mm. There is a small stone measuring 3 mm within the distal common bile duct. Findings compatible with choledocholithiasis. 3. Slight decrease in size of atypical enhancing lesion within the lateral segment of the left hepatic lobe. 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 4. Hepatic steatosis. Electronically Signed   By: Signa Kell M.D.   On: 03/18/2023 07:37   US ABDOMEN LIMITED RUQ (LIVER/GB) Result Date: 03/17/2023 CLINICAL DATA:  Elevated liver function tests. EXAM: ULTRASOUND ABDOMEN LIMITED RIGHT UPPER QUADRANT COMPARISON:  Abdominal ultrasound dated 08/25/2021 and MR abdomen dated 08/26/2021. FINDINGS: Gallbladder: Surgically absent.  No sonographic Murphy sign noted by sonographer. Common bile duct: Diameter: 5 mm Liver: A hypoechoic lesion in the left hepatic lobe measures 2.4 x 2.8 x 3.8 cm. This appears decreased in size compared to 08/25/2021. Increased parenchymal echogenicity. Portal vein is patent on color Doppler imaging with normal direction of blood flow towards the liver. Other: None. IMPRESSION: 1. Hypoechoic lesion in the left hepatic lobe appears decreased in size compared to 08/25/2021. 2. Increased hepatic parenchymal echogenicity is nonspecific but can be seen in the setting of hepatic steatosis. Electronically Signed   By: Romona Curls M.D.   On: 03/17/2023 09:03  CT ABDOMEN PELVIS W CONTRAST Result Date: 03/15/2023 CLINICAL DATA:  Acute abdominal pain EXAM: CT ABDOMEN AND PELVIS WITH CONTRAST TECHNIQUE: Multidetector CT imaging of the abdomen and pelvis was performed using the standard protocol following bolus administration of intravenous contrast. RADIATION DOSE REDUCTION: This exam was performed according to the departmental dose-optimization program which includes automated exposure control,  adjustment of the mA and/or kV according to patient size and/or use of iterative reconstruction technique. CONTRAST:  OMNIPAQUE IOHEXOL 300 MG/ML  SOLN COMPARISON:  MRI abdomen 08/26/2021 FINDINGS: Lower chest: No acute abnormality. Hepatobiliary: The liver is enlarged. There is fatty infiltration of the liver. Subtle heterogeneous lesion in the left lobe of the liver measures 4.4 by 3.1 cm similar to prior MRI. No definite new liver lesions are identified. Patient is status post cholecystectomy. There is no biliary ductal dilatation. Pancreas: Unremarkable. No pancreatic ductal dilatation or surrounding inflammatory changes. Spleen: Normal in size without focal abnormality. Adrenals/Urinary Tract: Adrenal glands are unremarkable. Kidneys are normal, without renal calculi, focal lesion, or hydronephrosis. Bladder is unremarkable. Stomach/Bowel: Stomach is within normal limits. Appendix appears normal. No evidence of bowel wall thickening, distention, or inflammatory changes. Vascular/Lymphatic: No significant vascular findings are present. No enlarged abdominal or pelvic lymph nodes. Reproductive: Uterus and bilateral adnexa are unremarkable. Other: No abdominal wall hernia or abnormality. No abdominopelvic ascites. Musculoskeletal: There are bilateral pars interarticularis defects at L5. There is 6 mm of anterolisthesis at L5-S1. IMPRESSION: 1. No acute localizing process in the abdomen or pelvis. 2. Hepatomegaly with fatty infiltration of the liver. 3. Stable heterogeneous lesion in the left lobe of the liver. Please see dedicated MRI abdomen 08/26/2021 for further description. 4. 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 Electronically Signed   By: Darliss Cheney M.D.   On: 03/15/2023 23:58    Microbiology: Results for orders placed or performed during the hospital encounter of 03/15/23  Urine Culture     Status: Abnormal   Collection Time: 03/15/23 11:19 PM    Specimen: Urine, Clean Catch  Result Value Ref Range Status   Specimen Description   Final    URINE, CLEAN CATCH Performed at [facility], [address] [place], [place] [address]    Special Requests   Final    NONE Performed at [facility], [address], [place], [place] [address]    Culture (A)  Final    <10,000 COLONIES/mL INSIGNIFICANT GROWTH Performed at [facility], [address] [place], [place] [address]    Report Status 03/17/2023 FINAL  Final    Labs: CBC: Recent Labs  Lab 03/16/23 2159 03/17/23 1456 03/18/23 0353 03/19/23 0350 03/20/23 0319  WBC 12.6* 12.0* 10.9* 11.9* 11.2*  HGB 12.6 12.4 12.4 12.2 12.4  HCT 39.6 39.3 40.3 40.2 39.8  MCV 77.6* 78.8* 80.3 81.4 79.6*  PLT 347 337 355 346 334   Basic Metabolic Panel: Recent Labs  Lab 03/16/23 2159 03/17/23 1456 03/18/23 0353 03/19/23 0350 03/20/23 0319  NA 135 136 137 138 137  K 3.9 4.6 3.9 4.0 4.0  CL 97* 98 100 102 100  CO2 25 27 24 25 26   GLUCOSE 122* 172* 119* 118* 113*  BUN 8 10 11 10 11   CREATININE 0.56 0.52 0.60 0.39* 0.38*  CALCIUM 9.5 9.3 9.0 9.2 9.2  MG  --   --   --  2.2  --    Liver Function Tests: Recent Labs  Lab 03/16/23  2159 03/17/23 1456 03/18/23 0353 03/19/23 0350 03/20/23 0319  AST 132* 138* 111* 115* 103*  ALT 281* 272* 259* 252* 232*  ALKPHOS 275* 260* 252* 251* 244*  BILITOT 5.7* 6.2* 5.4* 6.5* 7.2*  PROT 8.0 7.8 7.6 7.5 7.5  ALBUMIN 4.2 3.5 3.4* 3.5 3.4*   CBG: No results for input(s): "GLUCAP" in the last 168 hours.  Discharge time spent: greater than 30 minutes.  Signed: Thad Ranger, MD [facility] 03/20/2023

## 2023-03-20 NOTE — Op Note (Signed)
Amanda Amanda [name] Patient Name: Amanda Amanda [name] Procedure Date: 03/20/2023 MRN: [id number] Attending MD: Amanda Amanda [name] , MD, [phone] Date of Birth: 01-15-1996 CSN: [id number] Age: 27 Admit Type: Outpatient Procedure:                ERCP Indications:              Bile duct stone(s), Abdominal pain of suspected                            biliary origin, Abnormal MRCP Providers:                Amanda Amanda [name]. Amanda Dar, MD, Amanda Evangelist, RN, Amanda Amanda [name], Technician, Amanda Amanda [name], Technician Referring MD:             Amanda Der, MD Medicines:                General Anesthesia Complications:            No immediate complications. Estimated Blood Loss:     Estimated blood loss: none. Procedure:                Pre-Anesthesia Assessment:                           - Prior Amanda [name] the procedure, a History and Physical                            was performed, and patient medications and                            allergies were reviewed. The patient's tolerance of                            previous anesthesia was also reviewed. The risks                            and benefits of the procedure and the sedation                            options and risks were discussed with the patient.                            All questions were answered, and informed consent                            was obtained. Prior Anticoagulants: The patient has                            taken no anticoagulant or antiplatelet agents. ASA                            Grade Assessment: II - A patient with mild systemic  disease. After reviewing the risks and benefits,                            the patient was deemed in satisfactory condition Amanda [name]                            undergo the procedure.                           After obtaining informed consent, the scope was                            passed under direct vision. Throughout the                             procedure, the patient's blood pressure, pulse, and                            oxygen saturations were monitored continuously. The                            Amanda Amanda [name] single use                            duodenoscope was introduced through the mouth, and                            used Amanda [name] inject contrast into and used Amanda [name] inject                            contrast into the bile duct. The ERCP was                            accomplished without difficulty. The patient                            tolerated the procedure well. Scope In: Scope Out: Findings:      A scout film of the abdomen was obtained. Surgical clips, consistent       with a previous cholecystectomy, were seen in the area of the right       upper quadrant of the abdomen. The esophagus was successfully intubated       under direct vision. The scope was advanced Amanda [name] a normal major papilla in       the descending duodenum without detailed examination of the pharynx,       larynx and associated structures, and upper [name] tract. Limited exam of       the upper [name] tract was grossly normal. A straight Amanda Amanda [name] wire was       passed into the biliary tree after 2 brief passes into the PD. The       short-nosed traction sphincterotome was passed over the guidewire and       the bile duct was then deeply cannulated. Contrast was injected. I       personally interpreted the bile duct images. There was brisk flow of  contrast through the ducts. Image quality was adequate. A       cholecystectomy had been performed. The common bile duct contained a       filling defect thought Amanda [name] be a 3 mm stone. An 8 mm biliary       sphincterotomy was made with a traction (standard) sphincterotome using       Amanda [name] electrocautery. There was no post-sphincterotomy bleeding. The       biliary tree was swept three times with a 9 mm balloon starting at the       bifurcation. One stone was removed. No stones remained. The PD  was not       injected by intention. Impression:               - A filling defect consistent with a stone was seen                            on the cholangiogram.                           - Prior cholecystectomy.                           - Choledocholithiasis was found. Complete removal                            was accomplished by biliary sphincterotomy and                            balloon extraction.                           - A biliary sphincterotomy was performed.                           - The biliary tree was swept. Moderate Sedation:      Not Applicable - Patient had care per Anesthesia. Recommendation:           - Return patient Amanda [name] hospital ward for possible                            discharge same day.                           - Clear liquid diet for 2 hours, then advance as                            tolerated Amanda [name] low fat, soft diet today.                           - If tolerating diet without pain can be discharged                            today after 1700.                           - Observe patient's clinical course following  today's ERCP with therapeutic intervention.                           - Repeat LFTs per Amanda Amanda [name] or PCP as outpatient.                           - Outpatient [name] follow up with Amanda Amanda [name] Procedure Code(s):        --- Professional ---                           [id number], Endoscopic retrograde                            cholangiopancreatography (ERCP); with removal of                            calculi/debris from biliary/pancreatic duct(s)                           43262, Endoscopic retrograde                            cholangiopancreatography (ERCP); with                            sphincterotomy/papillotomy                           [phone], Endoscopic catheterization of the biliary                            ductal system, radiological supervision and                            interpretation Diagnosis Code(s):         --- Professional ---                           R93.2, Abnormal findings on diagnostic imaging of                            liver and biliary tract                           Z90.49, Acquired absence of other specified parts                            of digestive tract                           K80.50, Calculus of bile duct without cholangitis                            or cholecystitis without obstruction                           R10.9, Unspecified abdominal pain CPT copyright 2022 American Medical Association. All rights  reserved. The codes documented in this report are preliminary and upon coder review may  be revised Amanda [name] meet current compliance requirements. Amanda Dare, MD 03/20/2023 2:04:54 PM This report has been signed electronically. Number of Addenda: 0

## 2023-03-20 NOTE — Anesthesia Preprocedure Evaluation (Signed)
Anesthesia Evaluation  Patient identified by MRN, date of birth, ID band Patient awake    Reviewed: Allergy & Precautions, H&P , NPO status , Patient's Chart, lab work & pertinent test results  Airway Mallampati: II   Neck ROM: full    Dental   Pulmonary neg pulmonary ROS   breath sounds clear to auscultation       Cardiovascular negative cardio ROS  Rhythm:regular Rate:Normal     Neuro/Psych    GI/Hepatic   Endo/Other    Class 3 obesity  Renal/GU      Musculoskeletal   Abdominal   Peds  Hematology   Anesthesia Other Findings   Reproductive/Obstetrics                             Anesthesia Physical Anesthesia Plan  ASA: 2  Anesthesia Plan: General   Post-op Pain Management:    Induction: Intravenous  PONV Risk Score and Plan: 3 and Ondansetron, Dexamethasone and Treatment may vary due to age or medical condition  Airway Management Planned: Oral ETT  Additional Equipment:   Intra-op Plan:   Post-operative Plan: Extubation in OR  Informed Consent: I have reviewed the patients History and Physical, chart, labs and discussed the procedure including the risks, benefits and alternatives for the proposed anesthesia with the patient or authorized representative who has indicated his/her understanding and acceptance.     Dental advisory given  Plan Discussed with: CRNA, Anesthesiologist and Surgeon  Anesthesia Plan Comments:        Anesthesia Quick Evaluation

## 2023-03-20 NOTE — Interval H&P Note (Signed)
History and Physical Interval Note:  03/20/2023 12:54 PM  Amanda [name]  has presented today for surgery, with the diagnosis of CBD stone.  The various methods of treatment have been discussed with the patient and family. After consideration of risks, benefits and other options for treatment, the patient has consented to  Procedure(s): ENDOSCOPIC RETROGRADE CHOLANGIOPANCREATOGRAPHY (ERCP) (N/A) as a surgical intervention.  The patient's history has been reviewed, patient examined, no change in status, stable for surgery.  I have reviewed the patient's chart and labs.  Questions were answered to the patient's satisfaction.     Venita Lick. Russella Dar

## 2023-03-20 NOTE — Transfer of Care (Signed)
Immediate Anesthesia Transfer of Care Note  Patient: Amanda [name]  Procedure(s) Performed: ENDOSCOPIC RETROGRADE CHOLANGIOPANCREATOGRAPHY (ERCP) SPHINCTEROTOMY REMOVAL OF STONES  Patient Location: PACU  Anesthesia Type:General  Level of Consciousness: drowsy  Airway & Oxygen Therapy: Patient Spontanous Breathing and Patient connected to face mask oxygen  Post-op Assessment: Report given to RN and Post -op Vital signs reviewed and stable  Post vital signs: Reviewed and stable  Last Vitals:  Vitals Value Taken Time  BP 155/81 03/20/23 1408  Temp    Pulse 86 03/20/23 1410  Resp 20 03/20/23 1410  SpO2 99 % 03/20/23 1410  Vitals shown include unfiled device data.  Last Pain:  Vitals:   03/20/23 1213  TempSrc: Temporal  PainSc: 0-No pain      Patients Stated Pain Goal: 2 (03/18/23 2116)  Complications: No notable events documented.

## 2023-03-22 ENCOUNTER — Encounter (HOSPITAL_COMMUNITY): Payer: Self-pay | Admitting: Gastroenterology

## 2023-03-22 NOTE — Anesthesia Postprocedure Evaluation (Signed)
Anesthesia Post Note  Patient: Amanda [name]  Procedure(s) Performed: ENDOSCOPIC RETROGRADE CHOLANGIOPANCREATOGRAPHY (ERCP) SPHINCTEROTOMY REMOVAL OF STONES     Patient location during evaluation: PACU Anesthesia Type: General Level of consciousness: awake and alert Pain management: pain level controlled Vital Signs Assessment: post-procedure vital signs reviewed and stable Respiratory status: spontaneous breathing, nonlabored ventilation, respiratory function stable and patient connected to nasal cannula oxygen Cardiovascular status: blood pressure returned to baseline and stable Postop Assessment: no apparent nausea or vomiting Anesthetic complications: no   No notable events documented.  Last Vitals:  Vitals:   03/20/23 1430 03/20/23 1449  BP: (!) 158/89 (!) 158/88  Pulse: 82 77  Resp: 14 20  Temp:    SpO2: 95% 93%    Last Pain:  Vitals:   03/20/23 1430  TempSrc:   PainSc: 0-No pain                 Edye Hainline S

## 2023-03-26 DIAGNOSIS — R748 Abnormal levels of other serum enzymes: Secondary | ICD-10-CM | POA: Diagnosis not present

## 2023-03-26 DIAGNOSIS — K8050 Calculus of bile duct without cholangitis or cholecystitis without obstruction: Secondary | ICD-10-CM | POA: Diagnosis not present

## 2023-03-26 DIAGNOSIS — R109 Unspecified abdominal pain: Secondary | ICD-10-CM | POA: Diagnosis not present

## 2023-04-26 DIAGNOSIS — R7989 Other specified abnormal findings of blood chemistry: Secondary | ICD-10-CM | POA: Diagnosis not present

## 2023-04-26 DIAGNOSIS — R945 Abnormal results of liver function studies: Secondary | ICD-10-CM | POA: Diagnosis not present

## 2023-05-14 DIAGNOSIS — Z113 Encounter for screening for infections with a predominantly sexual mode of transmission: Secondary | ICD-10-CM | POA: Diagnosis not present

## 2023-05-14 DIAGNOSIS — Z01419 Encounter for gynecological examination (general) (routine) without abnormal findings: Secondary | ICD-10-CM | POA: Diagnosis not present

## 2023-05-14 DIAGNOSIS — Z130 Encounter for screening for diseases of the blood and blood-forming organs and certain disorders involving the immune mechanism: Secondary | ICD-10-CM | POA: Diagnosis not present

## 2023-09-09 ENCOUNTER — Emergency Department (HOSPITAL_BASED_OUTPATIENT_CLINIC_OR_DEPARTMENT_OTHER)
Admission: EM | Admit: 2023-09-09 | Discharge: 2023-09-09 | Disposition: A | Discharge status: Home / Self Care | Attending: Emergency Medicine | Admitting: Emergency Medicine

## 2023-09-09 ENCOUNTER — Other Ambulatory Visit: Payer: Self-pay

## 2023-09-09 ENCOUNTER — Encounter (HOSPITAL_BASED_OUTPATIENT_CLINIC_OR_DEPARTMENT_OTHER): Payer: Self-pay

## 2023-09-09 DIAGNOSIS — H00014 Hordeolum externum left upper eyelid: Secondary | ICD-10-CM | POA: Diagnosis not present

## 2023-09-09 DIAGNOSIS — H5712 Ocular pain, left eye: Secondary | ICD-10-CM | POA: Diagnosis not present

## 2023-09-09 NOTE — Discharge Instructions (Addendum)
 You can apply warm compresses 2-3 times per hour for 10 to 15 minutes at a time, over time this will help to express the clogged gland and resolve the swelling and redness.  If you do not have significant improvement of your symptoms after 1 to 2 weeks you can follow-up with your eye doctor for additional management.  This problem often takes 1 to 2 weeks to resolve, but can linger for longer than that.  Please return if you have sudden vision loss, significantly worsening pain, develop pus draining from the affected site.

## 2023-09-09 NOTE — ED Triage Notes (Signed)
 Pt POV from home c/o left eye swelling & pain since Thursday. Denies trauma States has happened before.

## 2023-09-09 NOTE — ED Notes (Signed)
 Dc instructions reviewed with patient. Patient voiced understanding. Dc with belongings.

## 2023-09-09 NOTE — ED Provider Notes (Signed)
 [facility] Provider Note   CSN: [id number] Arrival date & time: 09/09/23  4098     Patient presents with: Eye Problem   Amanda [name] is a 28 y.o. female with overall noncontributory past medical history presents with concern for left eye swelling and pain since Thursday.  Denies any trauma.  Reports something similar to happen before but resolves spontaneously although she did use some Systane eyedrops at the time.  No known history of styes or chalazion.  Has not seen an eye doctor in regards to the problem.  Is not affecting vision but does endorse some pain because of the swelling on the eye.    Eye Problem      Prior to Admission medications   Medication Sig Start Date End Date Taking? Authorizing Provider  ondansetron  (ZOFRAN -ODT) 4 MG disintegrating tablet Take 1 tablet (4 mg total) by mouth every 8 (eight) hours as needed for nausea or vomiting. 03/20/23   Rai, Hurman Maiden, MD  oxyCODONE  (OXY IR/ROXICODONE ) 5 MG immediate release tablet Take 1 tablet (5 mg total) by mouth every 6 (six) hours as needed for moderate pain (pain score 4-6) or severe pain (pain score 7-10). 03/20/23   Rai, Hurman Maiden, MD  SPRINTEC 28 0.25-35 MG-MCG tablet Take 1 tablet by mouth daily. 06/20/21   [provider]    Allergies: Patient has no known allergies.    Review of Systems  All other systems reviewed and are negative.   Updated Vital Signs Ht 5' 6 (1.676 m)   Wt 113.4 kg   LMP 09/05/2023 (Exact Date)   BMI 40.35 kg/m   Physical Exam Vitals and nursing note reviewed.  Constitutional:      General: She is not in acute distress.    Appearance: Normal appearance.  HENT:     Head: Normocephalic and atraumatic.   Eyes:     General:        Right eye: No discharge.        Left eye: No discharge.     Comments: Stye vs chalazion on inner edge of left upper eyelid. No purulence. Sclera is white, non irritated. Normal EOMS, no  proptosis, normal acuity.   Cardiovascular:     Rate and Rhythm: Normal rate and regular rhythm.  Pulmonary:     Effort: Pulmonary effort is normal. No respiratory distress.   Musculoskeletal:        General: No deformity.   Skin:    General: Skin is warm and dry.   Neurological:     Mental Status: She is alert and oriented to person, place, and time.   Psychiatric:        Mood and Affect: Mood normal.        Behavior: Behavior normal.     (all labs ordered are listed, but only abnormal results are displayed) Labs Reviewed - No data to display  EKG: None  Radiology: No results found.   Procedures   Medications Ordered in the ED - No data to display                                  Medical Decision Making  This an overall well-appearing 28 year old female who presents concern for eye swelling, pain.  My emergent differential diagnosis includes stye, Chalazion, conjunctivitis, blepharitis, lower clinical concern but considered preseptal or orbital cellulitis. On physical exam, Stye vs chalazion on  inner edge of left upper eyelid. No purulence. Sclera is white, non irritated. Normal EOMS, no proptosis, normal acuity. . Encouraged warm compresses, ophthalmology follow up as needed. Patient discharged in stable condition at this time.   Final diagnoses:  Hordeolum externum of left upper eyelid    ED Discharge Orders     None          Stefan Edge 09/09/23 1006    Rosealee Concha, MD 09/10/23 1023

## 2023-11-22 DIAGNOSIS — H60501 Unspecified acute noninfective otitis externa, right ear: Secondary | ICD-10-CM | POA: Diagnosis not present

## 2023-11-22 DIAGNOSIS — H9201 Otalgia, right ear: Secondary | ICD-10-CM | POA: Diagnosis not present

## 2023-12-18 DIAGNOSIS — E119 Type 2 diabetes mellitus without complications: Secondary | ICD-10-CM | POA: Diagnosis not present

## 2023-12-18 DIAGNOSIS — E785 Hyperlipidemia, unspecified: Secondary | ICD-10-CM | POA: Diagnosis not present

## 2023-12-18 DIAGNOSIS — Z0000 Encounter for general adult medical examination without abnormal findings: Secondary | ICD-10-CM | POA: Diagnosis not present

## 2023-12-18 DIAGNOSIS — E559 Vitamin D deficiency, unspecified: Secondary | ICD-10-CM | POA: Diagnosis not present

## 2023-12-18 DIAGNOSIS — D649 Anemia, unspecified: Secondary | ICD-10-CM | POA: Diagnosis not present
